# Patient Record
Sex: Female | Born: 1980 | Race: White | Hispanic: No | Marital: Single | Smoking: Current every day smoker
Health system: Southern US, Community
[De-identification: ages and names within clinical notes are randomized; demographics above are authoritative.]

## PROBLEM LIST (undated history)

## (undated) DIAGNOSIS — I10 Essential (primary) hypertension: Secondary | ICD-10-CM

## (undated) DIAGNOSIS — R569 Unspecified convulsions: Secondary | ICD-10-CM

---

## 2016-10-03 HISTORY — PX: FINGER SURGERY: SHX640

## 2016-10-15 ENCOUNTER — Encounter (HOSPITAL_BASED_OUTPATIENT_CLINIC_OR_DEPARTMENT_OTHER): Payer: Self-pay | Admitting: *Deleted

## 2016-10-15 ENCOUNTER — Emergency Department (HOSPITAL_BASED_OUTPATIENT_CLINIC_OR_DEPARTMENT_OTHER)
Admission: EM | Admit: 2016-10-15 | Discharge: 2016-10-15 | Disposition: A | Discharge status: Home / Self Care | Payer: Self-pay | Attending: Emergency Medicine | Admitting: Emergency Medicine

## 2016-10-15 ENCOUNTER — Emergency Department (HOSPITAL_BASED_OUTPATIENT_CLINIC_OR_DEPARTMENT_OTHER): Payer: Self-pay

## 2016-10-15 DIAGNOSIS — F17210 Nicotine dependence, cigarettes, uncomplicated: Secondary | ICD-10-CM | POA: Insufficient documentation

## 2016-10-15 DIAGNOSIS — I10 Essential (primary) hypertension: Secondary | ICD-10-CM | POA: Insufficient documentation

## 2016-10-15 DIAGNOSIS — Z5189 Encounter for other specified aftercare: Secondary | ICD-10-CM

## 2016-10-15 DIAGNOSIS — M7989 Other specified soft tissue disorders: Secondary | ICD-10-CM | POA: Insufficient documentation

## 2016-10-15 HISTORY — DX: Essential (primary) hypertension: I10

## 2016-10-15 HISTORY — DX: Unspecified convulsions: R56.9

## 2016-10-15 LAB — CBC WITH DIFFERENTIAL/PLATELET
Basophils Absolute: 0 10*3/uL (ref 0.0–0.1)
Basophils Relative: 0 %
EOS PCT: 1 %
Eosinophils Absolute: 0.1 10*3/uL (ref 0.0–0.7)
HEMATOCRIT: 41.7 % (ref 36.0–46.0)
HEMOGLOBIN: 13.4 g/dL (ref 12.0–15.0)
LYMPHS PCT: 24 %
Lymphs Abs: 2 10*3/uL (ref 0.7–4.0)
MCH: 19.4 pg — AB (ref 26.0–34.0)
MCHC: 32.1 g/dL (ref 30.0–36.0)
MCV: 60.4 fL — AB (ref 78.0–100.0)
MONOS PCT: 10 %
Monocytes Absolute: 0.8 10*3/uL (ref 0.1–1.0)
NEUTROS ABS: 5.5 10*3/uL (ref 1.7–7.7)
NEUTROS PCT: 65 %
Platelets: 270 10*3/uL (ref 150–400)
RBC: 6.9 MIL/uL — ABNORMAL HIGH (ref 3.87–5.11)
RDW: 18.4 % — ABNORMAL HIGH (ref 11.5–15.5)
WBC: 8.4 10*3/uL (ref 4.0–10.5)

## 2016-10-15 LAB — BASIC METABOLIC PANEL
Anion gap: 6 (ref 5–15)
BUN: 8 mg/dL (ref 6–20)
CHLORIDE: 108 mmol/L (ref 101–111)
CO2: 26 mmol/L (ref 22–32)
Calcium: 8.9 mg/dL (ref 8.9–10.3)
Creatinine, Ser: 0.82 mg/dL (ref 0.44–1.00)
GFR calc Af Amer: >60 mL/min (ref >60)
GLUCOSE: 126 mg/dL — AB (ref 65–99)
POTASSIUM: 3.2 mmol/L — AB (ref 3.5–5.1)
Sodium: 140 mmol/L (ref 135–145)

## 2016-10-15 MED ORDER — CLINDAMYCIN HCL 150 MG PO CAPS: 300.0000 mg | ORAL_CAPSULE | Freq: Three times a day (TID) | ORAL | 0 refills | Status: DC

## 2016-10-15 MED ORDER — CLINDAMYCIN PHOSPHATE 600 MG/50ML IV SOLN
600.0000 mg | Freq: Once | INTRAVENOUS | Status: AC
Start: 2016-10-15 — End: 2016-10-15
  Administered 2016-10-15: 600 mg via INTRAVENOUS
  Filled 2016-10-15: qty 50

## 2016-10-15 NOTE — Discharge Instructions (Signed)
Please contact your surgeon.  Notify them that you came to the ER today and were treated with broader spectrum antibiotics.  We recommend that you seen your surgeon ASAP.  Continue range of motion exercises.  If the redness, pain, or swelling worsen over the next 24-48 hours please return to the ER.

## 2016-10-15 NOTE — ED Triage Notes (Signed)
Patient states she had surgery on 10/03/16 for a staph infection of the left index finger at [facility] in [place].  States has pain in the distal joint of the finger and feels like it is hot to touch.  Still taking amoxicillin as prescribed by the surgeon.

## 2016-10-15 NOTE — ED Provider Notes (Signed)
[facility] Provider Note   CSN: [id number] Arrival date & time: 10/15/16  1205     History   Chief Complaint Chief Complaint  Patient presents with  . Post-op Problem    HPI Dawn Hall is a 36 y.o. female.  Patient presents to the emergency department with chief complaint of postop wound check. She states that she had a staph infection to her left index finger and had surgery at [facility] in [place] on 10/03/16. She reports that she had been feeling well, until today she noticed increased pain in the finger with some new redness and reported warmth. She states that she has been taking amoxicillin which was prescribed by her surgeon in [place]. She states that she has recently relocated to [place]. She reports increased pain with finger flexion. She denies any fever, chills, nausea, vomiting, or diarrhea.   The history is provided by the patient. No language interpreter was used.    Past Medical History:  Diagnosis Date  . Hypertension   . Seizures (HCC)    medication related.     There are no active problems to display for this patient.   Past Surgical History:  Procedure Laterality Date  . FINGER SURGERY Left 10/03/2016   staph infection left index finger    OB History    No data available       Home Medications    Prior to Admission medications   Medication Sig Start Date End Date Taking? Authorizing Provider  atenolol (TENORMIN) 25 MG tablet Take by mouth daily.   Yes Historical Provider, MD  clonazePAM (KLONOPIN) 2 MG tablet Take 2 mg by mouth 2 (two) times daily.   Yes Historical Provider, MD    Family History No family history on file.  Social History Social History  Substance Use Topics  . Smoking status: Current Every Day Smoker    Types: E-cigarettes  . Smokeless tobacco: Never Used  . Alcohol use No     Allergies   Patient has no known allergies.   Review of Systems Review of Systems  All other systems  reviewed and are negative.    Physical Exam Updated Vital Signs BP 142/96 (BP Location: Right Arm)   Temp 98.3 F (36.8 C) (Oral)   Resp 20   Ht 5\' 3"  (1.6 m)   Wt 63.5 kg   LMP 09/24/2016 (Exact Date)   SpO2 100%   BMI 24.80 kg/m   Physical Exam  Constitutional: She is oriented to person, place, and time. No distress.  HENT:  Head: Normocephalic and atraumatic.  Eyes: Conjunctivae and EOM are normal. Pupils are equal, round, and reactive to light.  Neck: No tracheal deviation present.  Cardiovascular: Normal rate.   Pulmonary/Chest: Effort normal. No respiratory distress.  Abdominal: Soft.  Musculoskeletal: Normal range of motion.  Left index finger flexion limited secondary to pain, mild swelling is noted, tender to palpation over the PIP  Neurological: She is alert and oriented to person, place, and time.  Skin: Skin is warm and dry. She is not diaphoretic.  Very mild erythema near the PIP of the left index finger, no purulent discharge  Psychiatric: Judgment normal.  Nursing note and vitals reviewed.        ED Treatments / Results  Labs (all labs ordered are listed, but only abnormal results are displayed) Labs Reviewed - No data to display  EKG  EKG Interpretation None       Radiology No results found.  Procedures  Procedures (including critical care time)  Medications Ordered in ED Medications - No data to display   Initial Impression / Assessment and Plan / ED Course  I have reviewed the triage vital signs and the nursing notes.  Pertinent labs & imaging results that were available during my care of the patient were reviewed by me and considered in my medical decision making (see chart for details).  Clinical Course     Patient with swelling of left index finger.  Had surgery 2 weeks ago at [facility] in [place].  Reports increase pain, swelling, and redness over the past day.  Has been taking amoxicillin.  Plain films shows soft tissue  swelling.  Patient discussed with Dr. Silverio Lay, who recommends labs and loading dose with Clindamycin.  Consultation with hand surgery after labs.  I discussed the case with Dr. Amanda Pea, who agrees with our initial clinical impression that the wound likely does not need more operative management today.  Dr. Amanda Pea agrees with plan to broaden antibiotic regimen.  Recommends that the patient contact her surgeon for close follow-up, but also states that if the symptoms worsen patient can return to ER or schedule an appointment with his office.  States that it would be best if patient continues care with surgeon who knows the situation better.  Final Clinical Impressions(s) / ED Diagnoses   Final diagnoses:  Visit for wound check    New Prescriptions New Prescriptions   CLINDAMYCIN (CLEOCIN) 150 MG CAPSULE    Take 2 capsules (300 mg total) by mouth 3 (three) times daily. May dispense as 150mg  capsules     Roxy Horseman, PA-C 10/15/16 1601    Charlynne Pander, MD 10/15/16 [phone]

## 2016-11-11 ENCOUNTER — Encounter: Payer: Self-pay | Admitting: Family Medicine

## 2016-11-11 ENCOUNTER — Ambulatory Visit: Payer: Self-pay | Admitting: Family Medicine

## 2016-11-11 ENCOUNTER — Ambulatory Visit: Payer: Self-pay | Attending: Family Medicine | Admitting: Family Medicine

## 2016-11-11 VITALS — BP 138/94 | HR 110 | Temp 98.4°F | Resp 18 | Ht 63.0 in | Wt 151.6 lb

## 2016-11-11 DIAGNOSIS — Z87891 Personal history of nicotine dependence: Secondary | ICD-10-CM | POA: Insufficient documentation

## 2016-11-11 DIAGNOSIS — Z79899 Other long term (current) drug therapy: Secondary | ICD-10-CM | POA: Insufficient documentation

## 2016-11-11 DIAGNOSIS — R053 Chronic cough: Secondary | ICD-10-CM

## 2016-11-11 DIAGNOSIS — R05 Cough: Secondary | ICD-10-CM

## 2016-11-11 DIAGNOSIS — J069 Acute upper respiratory infection, unspecified: Secondary | ICD-10-CM | POA: Insufficient documentation

## 2016-11-11 DIAGNOSIS — I10 Essential (primary) hypertension: Secondary | ICD-10-CM | POA: Insufficient documentation

## 2016-11-11 LAB — CBC WITH DIFFERENTIAL/PLATELET
Basophils Absolute: 0 cells/uL (ref 0–200)
Basophils Relative: 0 %
Eosinophils Absolute: 67 cells/uL (ref 15–500)
Eosinophils Relative: 1 %
HEMATOCRIT: 38 % (ref 35.0–45.0)
Hemoglobin: 12.3 g/dL (ref 11.7–15.5)
LYMPHS PCT: 16 %
Lymphs Abs: 1072 cells/uL (ref 850–3900)
MCH: 19.9 pg — ABNORMAL LOW (ref 27.0–33.0)
MCHC: 32.4 g/dL (ref 32.0–36.0)
MCV: 61.5 fL — ABNORMAL LOW (ref 80.0–100.0)
MONO ABS: 737 {cells}/uL (ref 200–950)
MONOS PCT: 11 %
NEUTROS PCT: 72 %
Neutro Abs: 4824 cells/uL (ref 1500–7800)
PLATELETS: 187 10*3/uL (ref 140–400)
RBC: 6.18 MIL/uL — AB (ref 3.80–5.10)
RDW: 17.6 % — ABNORMAL HIGH (ref 11.0–15.0)
WBC: 6.7 10*3/uL (ref 3.8–10.8)

## 2016-11-11 MED ORDER — AZITHROMYCIN 250 MG PO TABS: ORAL_TABLET | ORAL | 0 refills | Status: DC

## 2016-11-11 MED ORDER — BENZONATATE 200 MG PO CAPS: 200.0000 mg | ORAL_CAPSULE | Freq: Two times a day (BID) | ORAL | 0 refills | Status: AC | PRN

## 2016-11-11 MED ORDER — CLONIDINE HCL 0.2 MG PO TABS: 0.2000 mg | ORAL_TABLET | Freq: Every day | ORAL | 2 refills | Status: DC

## 2016-11-11 MED ORDER — ATENOLOL 50 MG PO TABS: 50.0000 mg | ORAL_TABLET | Freq: Every day | ORAL | 2 refills | Status: DC

## 2016-11-11 MED FILL — BENZONATATE 100 MG CAPSULE: 100 | 20 days supply | Qty: 80 | Fill #0

## 2016-11-11 MED FILL — AZITHROMYCIN 250 MG TABLET: 250 | 6 days supply | Qty: 6 | Fill #0

## 2016-11-11 NOTE — Patient Instructions (Signed)
Come back in 1 to 2 week for appointment for BP check up with clinical pharmacist.   Upper Respiratory Infection, Adult Most upper respiratory infections (URIs) are caused by a virus. A URI affects the nose, throat, and upper air passages. The most common type of URI is often called "the common cold." Follow these instructions at home:  Take medicines only as told by your doctor.  Gargle warm saltwater or take cough drops to comfort your throat as told by your doctor.  Use a warm mist humidifier or inhale steam from a shower to increase air moisture. This may make it easier to breathe.  Drink enough fluid to keep your pee (urine) clear or pale yellow.  Eat soups and other clear broths.  Have a healthy diet.  Rest as needed.  Go back to work when your fever is gone or your doctor says it is okay.  You may need to stay home longer to avoid giving your URI to others.  You can also wear a face mask and wash your hands often to prevent spread of the virus.  Use your inhaler more if you have asthma.  Do not use any tobacco products, including cigarettes, chewing tobacco, or electronic cigarettes. If you need help quitting, ask your doctor. Contact a doctor if:  You are getting worse, not better.  Your symptoms are not helped by medicine.  You have chills.  You are getting more short of breath.  You have brown or red mucus.  You have yellow or brown discharge from your nose.  You have pain in your face, especially when you bend forward.  You have a fever.  You have puffy (swollen) neck glands.  You have pain while swallowing.  You have white areas in the back of your throat. Get help right away if:  You have very bad or constant:  Headache.  Ear pain.  Pain in your forehead, behind your eyes, and over your cheekbones (sinus pain).  Chest pain.  You have long-lasting (chronic) lung disease and any of the following:  Wheezing.  Long-lasting  cough.  Coughing up blood.  A change in your usual mucus.  You have a stiff neck.  You have changes in your:  Vision.  Hearing.  Thinking.  Mood. This information is not intended to replace advice given to you by your health care provider. Make sure you discuss any questions you have with your health care provider. Document Released: 03/07/2008 Document Revised: 05/22/2016 Document Reviewed: 12/25/2013 Elsevier Interactive Patient Education  2017 [employer]

## 2016-11-11 NOTE — Progress Notes (Signed)
Subjective:  Patient ID: Dawn Hall, female    DOB: 1981/07/24  Age: 36 y.o. MRN: [id number]  CC: Establish Care   HPI Dawn Hall presents for complaint of cough, runny nose, and sore throat for 3 days. She denies any ear pain, body aches or chills, or fevers. Cough is dry, persistent, and very severe.  Reports cough prevents her from sleeping at night. She denies any hemoptysis or night sweats. She denies being around anyone else with similar symptoms. Reports formerly being an every day smoker 1 pack a day and quit 2 weeks ago. She is also requesting medication refills for her antihypertensive medications. She reports taking atenolol 25 mg daily and clonidine 0.2 mg at night for blood pressure control. She denies taking clonazepam and reports the last time she took this medication was when she was in her 5620's.     Outpatient Medications Prior to Visit  Medication Sig Dispense Refill  . atenolol (TENORMIN) 25 MG tablet Take by mouth daily.    . clonazePAM (KLONOPIN) 2 MG tablet Take 2 mg by mouth 2 (two) times daily.    . clindamycin (CLEOCIN) 150 MG capsule Take 2 capsules (300 mg total) by mouth 3 (three) times daily. May dispense as 150mg  capsules (Patient not taking: Reported on 11/11/2016) 60 capsule 0   No facility-administered medications prior to visit.     ROS Review of Systems  HENT: Positive for postnasal drip, rhinorrhea and sore throat.   Respiratory: Positive for cough.   Cardiovascular: Negative.   Gastrointestinal: Negative.   Skin: Negative.         Objective:  BP (!) 138/94 (BP Location: Left Arm, Patient Position: Sitting, Cuff Size: Normal)   Pulse (!) 110   Temp 98.4 F (36.9 C) (Oral)   Resp 18   Ht 5\' 3"  (1.6 m)   Wt 151 lb 9.6 oz (68.8 kg)   LMP 10/28/2016   SpO2 100%   BMI 26.85 kg/m   BP/Weight 11/11/2016 10/15/2016  Systolic BP 138 136  Diastolic BP 94 94  Wt. (Lbs) 151.6 140  BMI 26.85 24.8     Physical Exam  Constitutional: She has a  sickly appearance.  HENT:  Right Ear: External ear normal.  Left Ear: External ear normal.  Nose: Mucosal edema and rhinorrhea present.  Mouth/Throat: Posterior oropharyngeal erythema present.  Eyes: Pupils are equal, round, and reactive to light.  Cardiovascular: Normal rate, regular rhythm, normal heart sounds and intact distal pulses.   Pulmonary/Chest: Effort normal and breath sounds normal.  Abdominal: Soft. Bowel sounds are normal.  Nursing note and vitals reviewed.    Assessment & Plan:   Problem List Items Addressed This Visit    None    Visit Diagnoses    Upper respiratory tract infection, unspecified type    -  Primary   Relevant Medications   azithromycin (ZITHROMAX) 250 MG tablet   benzonatate (TESSALON) 200 MG capsule   Other Relevant Orders   Respiratory virus panel (Completed)   DG Chest 2 View (Completed)   Basic Metabolic Panel (Completed)   CBC with Differential (Completed)   Persistent dry cough       Relevant Orders   DG Chest 2 View (Completed)   Essential hypertension       Relevant Medications   atenolol (TENORMIN) 50 MG tablet   cloNIDine (CATAPRES) 0.2 MG tablet      Meds ordered this encounter  Medications  . azithromycin (ZITHROMAX) 250 MG tablet  Sig: Take 2 tablets (500 mg) total by mouth day one. Then 1 tablet (250 mg) by mouth daily.    Dispense:  6 tablet    Refill:  0    Order Specific Question:   Supervising Provider    Answer:   Quentin Angst [id number]  . benzonatate (TESSALON) 200 MG capsule    Sig: Take 1 capsule (200 mg total) by mouth 2 (two) times daily as needed for cough.    Dispense:  40 capsule    Refill:  0    Order Specific Question:   Supervising Provider    Answer:   Quentin Angst [id number]  . atenolol (TENORMIN) 50 MG tablet    Sig: Take 1 tablet (50 mg total) by mouth daily.    Dispense:  30 tablet    Refill:  2    Order Specific Question:   Supervising Provider    Answer:   Quentin Angst [id number]  . cloNIDine (CATAPRES) 0.2 MG tablet    Sig: Take 1 tablet (0.2 mg total) by mouth daily.    Dispense:  30 tablet    Refill:  2    Order Specific Question:   Supervising Provider    Answer:   Quentin Angst [id number]    Follow-up: Return in about 2 weeks (around 11/25/2016) for BP check with Stacy.Lizbeth Bark FNP

## 2016-11-11 NOTE — Progress Notes (Signed)
Patient is here for establish care  Patient is here for BP meds refill  Patient has eaten today  Patient has not taking any meds today  Patient complains about a dry cough that's been going on for 3 days  Patient stated that she started to sneeze today  Patient stated that she's only taking cough drop suppressant

## 2016-11-12 ENCOUNTER — Ambulatory Visit (HOSPITAL_BASED_OUTPATIENT_CLINIC_OR_DEPARTMENT_OTHER)
Admission: RE | Admit: 2016-11-12 | Discharge: 2016-11-12 | Disposition: A | Discharge status: Home / Self Care | Payer: Self-pay | Source: Ambulatory Visit | Attending: Family Medicine | Admitting: Family Medicine

## 2016-11-12 DIAGNOSIS — J069 Acute upper respiratory infection, unspecified: Secondary | ICD-10-CM | POA: Insufficient documentation

## 2016-11-12 DIAGNOSIS — R053 Chronic cough: Secondary | ICD-10-CM

## 2016-11-12 DIAGNOSIS — R05 Cough: Secondary | ICD-10-CM | POA: Insufficient documentation

## 2016-11-12 LAB — BASIC METABOLIC PANEL
BUN: 6 mg/dL — AB (ref 7–25)
CALCIUM: 8.5 mg/dL — AB (ref 8.6–10.2)
CO2: 21 mmol/L (ref 20–31)
Chloride: 108 mmol/L (ref 98–110)
Creat: 1.08 mg/dL (ref 0.50–1.10)
GLUCOSE: 112 mg/dL — AB (ref 65–99)
Potassium: 3.4 mmol/L — ABNORMAL LOW (ref 3.5–5.3)
Sodium: 141 mmol/L (ref 135–146)

## 2016-11-14 ENCOUNTER — Other Ambulatory Visit: Payer: Self-pay | Admitting: Family Medicine

## 2016-11-14 ENCOUNTER — Telehealth: Payer: Self-pay | Admitting: Family Medicine

## 2016-11-14 DIAGNOSIS — E876 Hypokalemia: Secondary | ICD-10-CM

## 2016-11-14 DIAGNOSIS — J101 Influenza due to other identified influenza virus with other respiratory manifestations: Secondary | ICD-10-CM

## 2016-11-14 LAB — RESPIRATORY VIRUS PANEL
ADENOVIRUS B: NOT DETECTED
INFLUENZA A H3: NOT DETECTED
INFLUENZA B 1: DETECTED — AB
Influenza A H1: NOT DETECTED
Influenza A: NOT DETECTED
Metapneumovirus: NOT DETECTED
Parainfluenza 1: NOT DETECTED
Parainfluenza 2: NOT DETECTED
Parainfluenza 3: NOT DETECTED
RESPIRATORY SYNCYTIAL VIRUS B: NOT DETECTED
Respiratory Syncytial Virus A: NOT DETECTED
Rhinovirus: NOT DETECTED

## 2016-11-14 MED ORDER — OSELTAMIVIR PHOSPHATE 75 MG PO CAPS: 75.0000 mg | ORAL_CAPSULE | Freq: Two times a day (BID) | ORAL | 0 refills | Status: DC

## 2016-11-14 MED ORDER — POTASSIUM CHLORIDE ER 10 MEQ PO TBCR: 10.0000 meq | EXTENDED_RELEASE_TABLET | Freq: Two times a day (BID) | ORAL | 0 refills | Status: AC

## 2016-11-14 MED FILL — POTASSIUM CL 10 MEQ TAB SA: 10 | 14 days supply | Qty: 28 | Fill #0

## 2016-11-14 MED FILL — ?CLONIDINE HCL 0.2 MG TABLE: 0.2 | 30 days supply | Qty: 30 | Fill #0

## 2016-11-14 MED FILL — ATENOLOL 50 MG TABLET: 50 | 30 days supply | Qty: 30 | Fill #0

## 2016-11-14 NOTE — Telephone Encounter (Signed)
Patient notified of labs results. She is agreeable with plan. Medication will be sent to patients pharmacy of choice.

## 2016-11-14 NOTE — Telephone Encounter (Signed)
Pt. Called requesting x-ray results. Please f/u

## 2016-11-14 NOTE — Telephone Encounter (Signed)
CMA call to go over lab results  Patient Verify DOB  Patient was aware and understood her results  Patient stated that she had some flu symptoms   Patient stated that her roommate had the flu  & that she was having fevers so she ask if she can be prescribe something for it

## 2016-11-14 NOTE — Telephone Encounter (Signed)
-----   Message from Lizbeth BarkMandesia R Hairston, FNP sent at 11/14/2016 12:41 PM EST ----- -CXR was normal. No showed no heart or lung disease. No pneumonia. -Lab showed potassium level is slightly decreased. You were prescribed a potassium supplement to help lower these levels. Schedule appointment to recheck potassium levels after supplement is complete.  -Continue your efforts to quit smoking. -Still awaiting results of respiratory virus panel.

## 2016-11-21 ENCOUNTER — Ambulatory Visit: Payer: Self-pay | Attending: Family Medicine

## 2016-12-08 ENCOUNTER — Telehealth: Payer: Self-pay | Admitting: Family Medicine

## 2016-12-08 ENCOUNTER — Ambulatory Visit: Payer: Self-pay | Attending: Family Medicine | Admitting: Pharmacist

## 2016-12-08 VITALS — BP 139/89 | HR 65

## 2016-12-08 DIAGNOSIS — F1910 Other psychoactive substance abuse, uncomplicated: Secondary | ICD-10-CM

## 2016-12-08 DIAGNOSIS — E876 Hypokalemia: Secondary | ICD-10-CM | POA: Insufficient documentation

## 2016-12-08 DIAGNOSIS — I10 Essential (primary) hypertension: Secondary | ICD-10-CM

## 2016-12-08 LAB — POTASSIUM: POTASSIUM: 4.2 mmol/L (ref 3.5–5.3)

## 2016-12-08 MED ORDER — CLONIDINE HCL 0.2 MG PO TABS: 0.2000 mg | ORAL_TABLET | Freq: Two times a day (BID) | ORAL | 2 refills | Status: DC

## 2016-12-08 MED ORDER — GABAPENTIN 300 MG PO CAPS: 900.0000 mg | ORAL_CAPSULE | Freq: Three times a day (TID) | ORAL | 1 refills | Status: DC

## 2016-12-08 MED ORDER — BUPROPION HCL ER (SR) 150 MG PO TB12: 150.0000 mg | ORAL_TABLET | Freq: Two times a day (BID) | ORAL | 1 refills | Status: DC

## 2016-12-08 MED FILL — BUPROPION SR 150 MG TABLET: 150 | 30 days supply | Qty: 60 | Fill #0

## 2016-12-08 MED FILL — cloNIDine HCL 0.2 MG TABS: 0.2 | 15 days supply | Qty: 30 | Fill #0

## 2016-12-08 MED FILL — GABAPENTIN 300 MG CAPSULE: 300 | 20 days supply | Qty: 180 | Fill #0

## 2016-12-08 NOTE — Telephone Encounter (Signed)
Quantity adjusted for 30 day supply

## 2016-12-08 NOTE — Patient Instructions (Signed)
Thanks for coming to see us!  I have sent your medications to the pharmacy. We do not have gabapentin 800 mg tablets so I have ordered the 300 mg capsules and you can take 3 at a time to get 900 mg.  I have sent a referral to a specialist to help with the management of bupropion and gabapentin.  Follow up with Mandesia in 2 weeks

## 2016-12-08 NOTE — Telephone Encounter (Signed)
Patient states she was in today and filled her Gabapentin and Clonidine, but states the quantity is incorrect Pt states she is instructed to take Gabapentin nine times a day which would equal 270 tablets but the pt only received 180 And states she is instructed to take Clonidine two times a day which would equal 60 tablets but pt only received 30

## 2016-12-08 NOTE — Progress Notes (Signed)
   S:    Patient arrives in good spirits.    Presents to the clinic for hypertension evaluation. Patient was referred on 11/11/16 by Arrie SenateMandesia Hairston NP/Dr. Hyman HopesJegede.  Patient was last seen by Primary Care Provider on 11/11/16.   Patient reports adherence with medications but she needs a refill on the clonidine because it has been out a few days since the dose was increased.  Current BP Medications include:  Atenolol 50 mg daily, and clonidine 0.2 mg BID (someone in [place] increase this last week)  Patient also needs refills on gabapentin 900 mg TID and bupropion 150 mg BID. She received them at the drug abuse treatment center in [place] but needs to establish with someone here.    O:   Last 3 Office BP readings: BP Readings from Last 3 Encounters:  12/08/16 139/89  11/11/16 (!) 138/94  10/15/16 136/94    BMET    Component Value Date/Time   NA 141 11/11/2016 1716   K 3.4 (L) 11/11/2016 1716   CL 108 11/11/2016 1716   CO2 21 11/11/2016 1716   GLUCOSE 112 (H) 11/11/2016 1716   BUN 6 (L) 11/11/2016 1716   CREATININE 1.08 11/11/2016 1716   CALCIUM 8.5 (L) 11/11/2016 1716   GFRNONAA >60 10/15/2016 1429   GFRAA >60 10/15/2016 1429    A/P: Hypertension longstanding currently controlled on current medications.  Continued medications as prescribed, and updated the clonidine dose in our system to reflect the BID dosing. Discussed with Dr. Hyman HopesJegede and we will bridge her bupropion and gabapentin therapy until we can get her established with psychiatry. Patient verbalized understanding.   With low potassium in the past, would consider ACEI/ARB if she needs additional blood pressure control in the future but the clonidine helps her sleep and the atenolol helps her anxiety so will not change those at this time.   Results reviewed and written information provided.   Total time in face-to-face counseling 30 minutes.   F/U Clinic Visit with [facility].  Patient seen with Cheral Bayasey Wells,  PharmD Candidate

## 2016-12-09 ENCOUNTER — Ambulatory Visit: Payer: Self-pay | Admitting: Family Medicine

## 2016-12-09 MED FILL — GABAPENTIN 300 MG CAPSULE: 300 | 30 days supply | Qty: 90 | Fill #0

## 2016-12-09 MED FILL — cloNIDine HCL 0.2 MG TABS: 0.2 | 30 days supply | Qty: 60 | Fill #0

## 2016-12-12 ENCOUNTER — Telehealth: Payer: Self-pay | Admitting: Family Medicine

## 2016-12-12 NOTE — Telephone Encounter (Signed)
Pt. Called requesting lab results from last OV. Please f/u °

## 2016-12-12 NOTE — Telephone Encounter (Signed)
Pt. Called requesting lab results from last OV. Please f/u

## 2016-12-13 ENCOUNTER — Telehealth: Payer: Self-pay

## 2016-12-13 NOTE — Telephone Encounter (Signed)
CMA call to go over lab results    Patient did not answer but CMA left a VM stating the results & if have any question just to call back

## 2016-12-13 NOTE — Telephone Encounter (Signed)
Lab for potassium level is normal. No additional potassium supplementation is needed.

## 2016-12-13 NOTE — Telephone Encounter (Signed)
-----   Message from Lizbeth BarkMandesia R Hairston, FNP sent at 12/13/2016 11:18 AM EDT ----- Potassium level is normal. No additional supplementation needed.

## 2016-12-14 NOTE — Telephone Encounter (Signed)
CMA call to go over lab results  Patient verify DOB  Patient was aware and understood   

## 2016-12-14 NOTE — Telephone Encounter (Signed)
Pt returning call with questions regarding results

## 2017-01-02 MED FILL — BUPROPION SR 150 MG TABLET: 150 | 30 days supply | Qty: 60 | Fill #1

## 2017-01-02 MED FILL — GABAPENTIN 300 MG CAPSULE: 300 | 30 days supply | Qty: 270 | Fill #1

## 2017-01-04 MED FILL — ?CLONIDINE HCL 0.2 MG TABLE: 0.2 | 30 days supply | Qty: 60 | Fill #1

## 2017-01-27 ENCOUNTER — Telehealth: Payer: Self-pay | Admitting: Family Medicine

## 2017-01-27 NOTE — Telephone Encounter (Signed)
Medication Refill:  buPROPion (WELLBUTRIN SR) 150 MG 12 hr tablet  gabapentin (NEURONTIN) 300 MG capsule

## 2017-01-30 NOTE — Telephone Encounter (Signed)
Refill for 30 day supply only. She need to come to office for follow up regarding these medications.

## 2017-01-30 NOTE — Telephone Encounter (Signed)
Error JA 

## 2017-01-31 MED FILL — GABAPENTIN 300 MG CAPSULE: 300 | 20 days supply | Qty: 180 | Fill #2

## 2017-02-01 ENCOUNTER — Other Ambulatory Visit: Payer: Self-pay | Admitting: Internal Medicine

## 2017-02-01 MED FILL — ?CLONIDINE HCL 0.2 MG TABLE: 0.2 | 30 days supply | Qty: 60 | Fill #2

## 2017-02-06 ENCOUNTER — Ambulatory Visit: Payer: Self-pay | Admitting: Family Medicine

## 2017-02-16 ENCOUNTER — Other Ambulatory Visit: Payer: Self-pay | Admitting: Internal Medicine

## 2017-03-06 ENCOUNTER — Ambulatory Visit: Payer: Self-pay | Attending: Family Medicine | Admitting: Family Medicine

## 2017-03-06 ENCOUNTER — Encounter: Payer: Self-pay | Admitting: Family Medicine

## 2017-03-06 VITALS — BP 139/89 | HR 87 | Temp 98.2°F | Resp 18 | Ht 64.0 in | Wt 159.8 lb

## 2017-03-06 DIAGNOSIS — F419 Anxiety disorder, unspecified: Secondary | ICD-10-CM | POA: Insufficient documentation

## 2017-03-06 DIAGNOSIS — Z87898 Personal history of other specified conditions: Secondary | ICD-10-CM

## 2017-03-06 DIAGNOSIS — I10 Essential (primary) hypertension: Secondary | ICD-10-CM

## 2017-03-06 DIAGNOSIS — F329 Major depressive disorder, single episode, unspecified: Secondary | ICD-10-CM | POA: Insufficient documentation

## 2017-03-06 DIAGNOSIS — Z8659 Personal history of other mental and behavioral disorders: Secondary | ICD-10-CM

## 2017-03-06 LAB — POCT UA - MICROALBUMIN
Creatinine, POC: 50 mg/dL
Microalbumin Ur, POC: 10 mg/L

## 2017-03-06 MED ORDER — GABAPENTIN 300 MG PO CAPS: 900.0000 mg | ORAL_CAPSULE | Freq: Three times a day (TID) | ORAL | 2 refills | Status: AC

## 2017-03-06 MED ORDER — BUPROPION HCL ER (SR) 150 MG PO TB12: 150.0000 mg | ORAL_TABLET | Freq: Two times a day (BID) | ORAL | 2 refills | Status: AC

## 2017-03-06 MED ORDER — CLONIDINE HCL 0.2 MG PO TABS: 0.2000 mg | ORAL_TABLET | Freq: Two times a day (BID) | ORAL | 2 refills | Status: AC

## 2017-03-06 MED ORDER — ATENOLOL 50 MG PO TABS: 50.0000 mg | ORAL_TABLET | Freq: Every day | ORAL | 2 refills | Status: AC

## 2017-03-06 MED FILL — BUPROPION SR 150 MG TABLET: 150 | 30 days supply | Qty: 60 | Fill #0

## 2017-03-06 MED FILL — ATENOLOL 50 MG TABLET: 50 | 30 days supply | Qty: 30 | Fill #0

## 2017-03-06 MED FILL — cloNIDine HCL 0.2 MG TABS: 0.2 | 30 days supply | Qty: 60 | Fill #0

## 2017-03-06 NOTE — Patient Instructions (Signed)

## 2017-03-06 NOTE — Progress Notes (Signed)
Subjective:  Patient ID: Dawn Hall, female    DOB: 1981-02-22  Age: 36 y.o. MRN: [id number]  CC: Establish Care   HPI Dawn Hall presents for hypertension follow up. She is not exercising and is not adherent to low salt diet. She does not check her blood pressures at home.  Cardiac symptoms none. Patient denies chest pain, chest pressure/discomfort, claudication, lower extremity edema, near-syncope and palpitations.  Cardiovascular risk factors: hypertension, sedentary lifestyle and smoking/ tobacco exposure. She reports smoking vapes. Use of agents associated with hypertension: noneHistory of target organ damage: none. She is also requesting medication refills for her antihypertensive medications she reports only taking clonidine.   Depression: Patient complains of depression. She complains of anhedonia, depressed mood, fatigue and poor appetite. Onset was approximately more than 10 years ago., stable since that time.  She denies current suicidal and homicidal plan or intent. Possible organic causes contributing are: history of substance abuse..  Risk factors: previous episode of depression, anxiety, and history of substance abuse. Previous treatment includes Wellbutrin and gabapentin and current [facility]. She complains of the following side effects from the treatment: none. She denies additional counseling resources at this time.    Outpatient Medications Prior to Visit  Medication Sig Dispense Refill  . azithromycin (ZITHROMAX) 250 MG tablet Take 2 tablets (500 mg) total by mouth day one. Then 1 tablet (250 mg) by mouth daily. 6 tablet 0  . benzonatate (TESSALON) 200 MG capsule Take 1 capsule (200 mg total) by mouth 2 (two) times daily as needed for cough. 40 capsule 0  . clindamycin (CLEOCIN) 150 MG capsule Take 2 capsules (300 mg total) by mouth 3 (three) times daily. May dispense as 150mg  capsules (Patient not taking: Reported on 11/11/2016) 60  capsule 0  . oseltamivir (TAMIFLU) 75 MG capsule Take 1 capsule (75 mg total) by mouth 2 (two) times daily. 10 capsule 0  . potassium chloride (K-DUR) 10 MEQ tablet Take 1 tablet (10 mEq total) by mouth 2 (two) times daily. 28 tablet 0  . atenolol (TENORMIN) 50 MG tablet Take 1 tablet (50 mg total) by mouth daily. 30 tablet 2  . buPROPion (WELLBUTRIN SR) 150 MG 12 hr tablet Take 1 tablet (150 mg total) by mouth 2 (two) times daily. 60 tablet 1  . cloNIDine (CATAPRES) 0.2 MG tablet Take 1 tablet (0.2 mg total) by mouth 2 (two) times daily. 60 tablet 2  . gabapentin (NEURONTIN) 300 MG capsule Take 3 capsules (900 mg total) by mouth 3 (three) times daily. 270 capsule 1   No facility-administered medications prior to visit.     ROS Review of Systems  Constitutional: Negative.   Eyes: Negative.   Respiratory: Negative.   Cardiovascular: Negative.   Gastrointestinal: Negative.   Psychiatric/Behavioral: Positive for dysphoric mood (history of depression/anxiety). The patient is nervous/anxious (history of depression/anxiety).     Objective:  BP 139/89 (BP Location: Left Arm, Patient Position: Sitting, Cuff Size: Normal)   Pulse 87   Temp 98.2 F (36.8 C) (Oral)   Resp 18   Ht 5\' 4"  (1.626 m)   Wt 159 lb 12.8 oz (72.5 kg)   SpO2 100%   BMI 27.43 kg/m   BP/Weight 03/06/2017 12/08/2016 11/11/2016  Systolic BP 139 139 138  Diastolic BP 89 89 94  Wt. (Lbs) 159.8 - 151.6  BMI 27.43 - 26.85    Physical Exam  Constitutional: She appears well-developed and well-nourished.  Eyes: Conjunctivae are normal. Pupils  are equal, round, and reactive to light.  Neck: Normal range of motion. No JVD present.  Cardiovascular: Normal rate, regular rhythm, normal heart sounds and intact distal pulses.   Pulmonary/Chest: Effort normal and breath sounds normal.  Abdominal: Soft. Bowel sounds are normal.  Skin: Skin is warm and dry.  Psychiatric: Her mood appears anxious. She expresses no homicidal and no  suicidal ideation. She expresses no suicidal plans and no homicidal plans.  Nursing note and vitals reviewed.   Assessment & Plan:   Problem List Items Addressed This Visit    None    Visit Diagnoses    Essential hypertension    -  Primary   Encouraged using all medications prescribed for BP for better BP control.   Follow up with clinical pharmacist in 2 weeks.   Follow up with PCP in 3 months.   Relevant Medications   cloNIDine (CATAPRES) 0.2 MG tablet   atenolol (TENORMIN) 50 MG tablet   buPROPion (WELLBUTRIN SR) 150 MG 12 hr tablet   gabapentin (NEURONTIN) 300 MG capsule   Other Relevant Orders   Lipid Panel   POCT UA - Microalbumin (Completed)   History of depression       Relevant Medications   buPROPion (WELLBUTRIN SR) 150 MG 12 hr tablet   History of anxiety       Relevant Medications   gabapentin (NEURONTIN) 300 MG capsule   History of substance use disorder       Relevant Medications   gabapentin (NEURONTIN) 300 MG capsule      Meds ordered this encounter  Medications  . cloNIDine (CATAPRES) 0.2 MG tablet    Sig: Take 1 tablet (0.2 mg total) by mouth 2 (two) times daily.    Dispense:  60 tablet    Refill:  2    Order Specific Question:   Supervising Provider    Answer:   Quentin Angst [id number]  . atenolol (TENORMIN) 50 MG tablet    Sig: Take 1 tablet (50 mg total) by mouth daily.    Dispense:  30 tablet    Refill:  2    Order Specific Question:   Supervising Provider    Answer:   Quentin Angst [id number]  . buPROPion (WELLBUTRIN SR) 150 MG 12 hr tablet    Sig: Take 1 tablet (150 mg total) by mouth 2 (two) times daily.    Dispense:  60 tablet    Refill:  2    Order Specific Question:   Supervising Provider    Answer:   Quentin Angst [id number]  . gabapentin (NEURONTIN) 300 MG capsule    Sig: Take 3 capsules (900 mg total) by mouth 3 (three) times daily.    Dispense:  270 capsule    Refill:  2    Order Specific Question:    Supervising Provider    Answer:   Quentin Angst [[id number]]    Follow-up: Return in about 2 weeks (around 03/20/2017) for BP check with Stacy.   Lizbeth Bark FNP

## 2017-03-06 NOTE — Progress Notes (Signed)
Patient is here for med refill & f/up   Patient needs refill on bupropion clonidine & gabapentin

## 2017-03-17 ENCOUNTER — Inpatient Hospital Stay (HOSPITAL_BASED_OUTPATIENT_CLINIC_OR_DEPARTMENT_OTHER)
Admission: EM | Admit: 2017-03-17 | Discharge: 2017-03-19 | DRG: 684 | Disposition: A | Discharge status: Home / Self Care | Payer: Self-pay | Attending: Family Medicine | Admitting: Family Medicine

## 2017-03-17 ENCOUNTER — Encounter (HOSPITAL_BASED_OUTPATIENT_CLINIC_OR_DEPARTMENT_OTHER): Payer: Self-pay | Admitting: Emergency Medicine

## 2017-03-17 ENCOUNTER — Emergency Department (HOSPITAL_BASED_OUTPATIENT_CLINIC_OR_DEPARTMENT_OTHER): Payer: Self-pay

## 2017-03-17 DIAGNOSIS — F1990 Other psychoactive substance use, unspecified, uncomplicated: Secondary | ICD-10-CM

## 2017-03-17 DIAGNOSIS — D72828 Other elevated white blood cell count: Secondary | ICD-10-CM

## 2017-03-17 DIAGNOSIS — R0781 Pleurodynia: Secondary | ICD-10-CM

## 2017-03-17 DIAGNOSIS — B349 Viral infection, unspecified: Secondary | ICD-10-CM | POA: Diagnosis present

## 2017-03-17 DIAGNOSIS — R6520 Severe sepsis without septic shock: Secondary | ICD-10-CM

## 2017-03-17 DIAGNOSIS — I959 Hypotension, unspecified: Secondary | ICD-10-CM

## 2017-03-17 DIAGNOSIS — E876 Hypokalemia: Secondary | ICD-10-CM | POA: Diagnosis present

## 2017-03-17 DIAGNOSIS — Z8249 Family history of ischemic heart disease and other diseases of the circulatory system: Secondary | ICD-10-CM

## 2017-03-17 DIAGNOSIS — N179 Acute kidney failure, unspecified: Principal | ICD-10-CM | POA: Diagnosis present

## 2017-03-17 DIAGNOSIS — F17290 Nicotine dependence, other tobacco product, uncomplicated: Secondary | ICD-10-CM | POA: Diagnosis present

## 2017-03-17 DIAGNOSIS — A419 Sepsis, unspecified organism: Secondary | ICD-10-CM

## 2017-03-17 DIAGNOSIS — E860 Dehydration: Secondary | ICD-10-CM | POA: Diagnosis present

## 2017-03-17 DIAGNOSIS — I10 Essential (primary) hypertension: Secondary | ICD-10-CM | POA: Diagnosis present

## 2017-03-17 MED ORDER — NAPROXEN 250 MG PO TABS
500.0000 mg | ORAL_TABLET | Freq: Once | ORAL | Status: AC
  Administered 2017-03-17: 500 mg via ORAL
  Filled 2017-03-17: qty 2

## 2017-03-17 NOTE — ED Triage Notes (Addendum)
PT presents with c/o upper middle chest pain, shortness of breath, LUQ pain for 2 days.

## 2017-03-17 NOTE — ED Provider Notes (Signed)
[facility] Note   CSN: [id number] Arrival date & time: 03/17/17  2329  By signing my name below, I, Diona BrownerJennifer Gorman, attest that this documentation has been prepared under the direction and in the presence of Michal Callicott, Mayer Maskerourtney F, MD. Electronically Signed: Diona BrownerJennifer Gorman, ED Scribe. 03/17/17. 11:46 PM.  History   Chief Complaint Chief Complaint  Patient presents with  . Chest Pain    HPI Dawn Hall is a 36 y.o. female with a PMHx of HTN, who presents to the Emergency Department complaining of sharp, 6/10, left sided chest pain for the last two days. Associated sx include SOB, diaphoresis. Deep breathes exacerbate her pain.  Sometimes the pain radiates into left upper quadrant. Reports chills and initially reported no fever; however, after further probing, patient reports that she had a temperature 100.1.  No hx of blood clots. No recent travel. No hx of heart disease. Smokes. 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 Pt denies cough.  Additionally, pt c/o of not being able to sleep for the last couple of weeks. She has taken benadryl with no relief. Pt also notes being under a lot of stress.   The history is provided by the patient. No language interpreter was used.    Past Medical History:  Diagnosis Date  . Hypertension   . Seizures (HCC)    medication related.     There are no active problems to display for this patient.   Past Surgical History:  Procedure Laterality Date  . FINGER SURGERY Left 10/03/2016   staph infection left index finger    OB History    No data available       Home Medications    Prior to Admission medications   Medication Sig Start Date End Date Taking? Authorizing Provider  atenolol (TENORMIN) 50 MG tablet Take 1 tablet (50 mg total) by mouth daily. 03/06/17   Lizbeth BarkHairston, Mandesia R, FNP  azithromycin (ZITHROMAX) 250 MG tablet Take 2 tablets (500 mg) total by mouth day one. Then 1 tablet (250 mg) by mouth daily.  11/11/16   Lizbeth BarkHairston, Mandesia R, FNP  benzonatate (TESSALON) 200 MG capsule Take 1 capsule (200 mg total) by mouth 2 (two) times daily as needed for cough. 11/11/16   Lizbeth BarkHairston, Mandesia R, FNP  buPROPion (WELLBUTRIN SR) 150 MG 12 hr tablet Take 1 tablet (150 mg total) by mouth 2 (two) times daily. 03/06/17   Lizbeth BarkHairston, Mandesia R, FNP  clindamycin (CLEOCIN) 150 MG capsule Take 2 capsules (300 mg total) by mouth 3 (three) times daily. May dispense as 150mg  capsules Patient not taking: Reported on 11/11/2016 10/15/16   Roxy HorsemanBrowning, Robert, PA-C  cloNIDine (CATAPRES) 0.2 MG tablet Take 1 tablet (0.2 mg total) by mouth 2 (two) times daily. 03/06/17   Lizbeth BarkHairston, Mandesia R, FNP  gabapentin (NEURONTIN) 300 MG capsule Take 3 capsules (900 mg total) by mouth 3 (three) times daily. 03/06/17   Lizbeth BarkHairston, Mandesia R, FNP  oseltamivir (TAMIFLU) 75 MG capsule Take 1 capsule (75 mg total) by mouth 2 (two) times daily. 11/14/16   Lizbeth BarkHairston, Mandesia R, FNP  potassium chloride (K-DUR) 10 MEQ tablet Take 1 tablet (10 mEq total) by mouth 2 (two) times daily. 11/14/16   Lizbeth BarkHairston, Mandesia R, FNP    Family History No family history on file.  Social History Social History  Substance Use Topics  . Smoking status: Current Every Day Smoker    Types: E-cigarettes  . Smokeless tobacco: Never Used  . Alcohol use No  Allergies   Patient has no known allergies.   Review of Systems Review of Systems  Constitutional: Positive for diaphoresis.  Respiratory: Positive for shortness of breath. Negative for cough.   Cardiovascular: Positive for chest pain.  Gastrointestinal: Positive for abdominal pain. Negative for nausea and vomiting.  Skin: Negative for color change and rash.  Psychiatric/Behavioral: Positive for sleep disturbance.  All other systems reviewed and are negative.    Physical Exam Updated Vital Signs BP 112/78   Pulse 74   Temp 97.8 F (36.6 C) (Oral)   Resp 16   Ht 5\' 3"  (1.6 m)   Wt 68 kg (150 lb)   LMP  02/14/2017   SpO2 100%   BMI 26.57 kg/m   Physical Exam  Constitutional: She is oriented to person, place, and time. She appears well-developed and well-nourished. No distress.  HENT:  Head: Normocephalic and atraumatic.  Eyes: Pupils are equal, round, and reactive to light.  Cardiovascular: Normal rate, regular rhythm, normal heart sounds and intact distal pulses.   Pulmonary/Chest: Effort normal. No respiratory distress. She has wheezes. She exhibits tenderness.  Occasional expiratory wheeze  Abdominal: Soft. Bowel sounds are normal. There is no tenderness.  Musculoskeletal: She exhibits no edema.  Neurological: She is alert and oriented to person, place, and time.  Skin: Skin is warm and dry. No rash noted.  Psychiatric: She has a normal mood and affect.  Nursing note and vitals reviewed.    ED Treatments / Results  DIAGNOSTIC STUDIES: Oxygen Saturation is 100% on RA, normal by my interpretation.   COORDINATION OF CARE: 11:46 PM-Discussed next steps with pt which includes . Pt verbalized understanding and is agreeable with the plan.   Labs (all labs ordered are listed, but only abnormal results are displayed) Labs Reviewed  D-DIMER, QUANTITATIVE (NOT AT [facility]) - Abnormal; Notable for the following:       Result Value   D-Dimer, Quant 1.85 (*)    All other components within normal limits  CBC WITH DIFFERENTIAL/PLATELET - Abnormal; Notable for the following:    WBC 19.1 (*)    RBC 7.43 (*)    Hemoglobin 15.1 (*)    MCV 59.9 (*)    MCH 20.3 (*)    RDW 18.7 (*)    Neutro Abs 14.1 (*)    All other components within normal limits  BASIC METABOLIC PANEL - Abnormal; Notable for the following:    Potassium 3.1 (*)    CO2 19 (*)    Glucose, Bld 180 (*)    BUN 22 (*)    Creatinine, Ser 1.61 (*)    GFR calc non Af Amer 40 (*)    GFR calc Af Amer 46 (*)    All other components within normal limits  CULTURE, BLOOD (ROUTINE X 2)  CULTURE, BLOOD (ROUTINE X 2)  TROPONIN I    URINALYSIS, ROUTINE W REFLEX MICROSCOPIC  I-STAT CG4 LACTIC ACID, ED    EKG  EKG Interpretation  Date/Time:  Friday March 17 2017 23:33:21 EDT Ventricular Rate:  75 PR Interval:  130 QRS Duration: 84 QT Interval:  442 QTC Calculation: 493 R Axis:   64 Text Interpretation:  Normal sinus rhythm Possible Left atrial enlargement Prolonged QT Abnormal ECG no prior for comparison Confirmed by Ross Marcus ([id number]) on 03/17/2017 11:36:21 PM       Radiology Dg Chest 2 View  Result Date: 03/18/2017 CLINICAL DATA:  Middle chest pain EXAM: CHEST  2 VIEW COMPARISON:  11/12/2016 FINDINGS: The  heart size and mediastinal contours are within normal limits. Both lungs are clear. The visualized skeletal structures are unremarkable. IMPRESSION: No active cardiopulmonary disease. Electronically Signed   By: Jasmine Pang M.D.   On: 03/18/2017 00:00   Ct Angio Chest Pe W Or Wo Contrast  Result Date: 03/18/2017 CLINICAL DATA:  Acute onset of upper mid chest pain, shortness of breath and left upper quadrant abdominal pain. Elevated D-dimer. Initial encounter. EXAM: CT ANGIOGRAPHY CHEST WITH CONTRAST TECHNIQUE: Multidetector CT imaging of the chest was performed using the standard protocol during bolus administration of intravenous contrast. Multiplanar CT image reconstructions and MIPs were obtained to evaluate the vascular anatomy. CONTRAST:  75 mL of Isovue 370 IV contrast COMPARISON:  Chest radiograph performed 03/17/2017 FINDINGS: Cardiovascular:  There is no evidence of pulmonary embolus. The heart is normal in size. The thoracic aorta is grossly unremarkable. The great vessels are within normal limits. No calcific atherosclerotic disease is seen. Mediastinum/Nodes: The mediastinum is unremarkable in appearance. No mediastinal lymphadenopathy is seen. No pericardial effusion is identified. The thyroid gland is unremarkable. No axillary lymphadenopathy is seen. Lungs/Pleura: The lungs are essentially clear  bilaterally. No focal consolidation, pleural effusion or pneumothorax is seen. No masses are identified. Upper Abdomen: The visualized portions of the liver and spleen are grossly unremarkable. The visualized portions of the gallbladder, adrenal glands and left kidney are within normal limits. Musculoskeletal: No acute osseous abnormalities are identified. The visualized musculature is unremarkable in appearance. Review of the MIP images confirms the above findings. IMPRESSION: 1. No evidence of pulmonary embolus. 2. Lungs clear bilaterally. Electronically Signed   By: Roanna Raider M.D.   On: 03/18/2017 01:20    Procedures Procedures (including critical care time)  CRITICAL CARE Performed by: Shon Baton   Total critical care time: 25 minutes  Critical care time was exclusive of separately billable procedures and treating other patients.  Critical care was necessary to treat or prevent imminent or life-threatening deterioration.  Critical care was time spent personally by me on the following activities: development of treatment plan with patient and/or surrogate as well as nursing, discussions with consultants, evaluation of patient's response to treatment, examination of patient, obtaining history from patient or surrogate, ordering and performing treatments and interventions, ordering and review of laboratory studies, ordering and review of radiographic studies, pulse oximetry and re-evaluation of patient's condition.   Medications Ordered in ED Medications  vancomycin (VANCOCIN) IVPB 1000 mg/200 mL premix (not administered)  naproxen (NAPROSYN) tablet 500 mg (500 mg Oral Given 03/17/17 2356)  iopamidol (ISOVUE-370) 76 % injection 100 mL (100 mLs Intravenous Contrast Given 03/18/17 0048)  sodium chloride 0.9 % bolus 1,000 mL (0 mLs Intravenous Stopped 03/18/17 0156)  sodium chloride 0.9 % bolus 1,000 mL (1,000 mLs Intravenous New Bag/Given 03/18/17 0225)     Initial Impression /  Assessment and Plan / ED Course  I have reviewed the triage vital signs and the nursing notes.  Pertinent labs & imaging results that were available during my care of the patient were reviewed by me and considered in my medical decision making (see chart for details).  Clinical Course as of Mar 18 228  Sat Mar 18, 2017  0159 D-dimer positive. Patient sent for CT scan which is negative for pulmonary embolism. However, she has had persistently marginal blood pressures. Reports she took her blood pressure medication at 11 AM. She also has a notable leukocytosis. Reports MAXIMUM TEMPERATURE of 100.1 earlier today. No other fevers or infectious  symptoms. CT does not show any evidence of infiltrate. Urinalysis, lactate, blood cultures added. Patient does have history of IV drug use.  [CH]    Clinical Course User Index [CH] Jeriah Corkum, Mayer Masker, MD    Patient presents with chest pain ongoing for the last 2 days. She is nontoxic. Initial blood pressure 100/69. She is a history of hypertension. Otherwise vital signs reassuring. Satting 100% on room air. Afebrile. She does have some reproducible pain on exam. Very atypical for ACS.  PE consideration. Initially, patient did not report fever but after further probing states that she had a temperature of 100.1. See clinical course above. After reviewing lab work, patient has acute kidney injury and leukocytosis to 19.1 which is not insignificant. Sepsis workup added. She did respond to 1 L of fluids and was given a total of 30 mL/kg or 2.5 L of fluid. She has a history of IV drug use. Last used 15 days ago. Endocarditis would certainly be a consideration as well. No obvious murmur on exam however, given marginal blood pressure in someone whose other wise hypertensive, leukocytosis, and acute kidney injury which evidence of end organ damage, would opt to treat for severe sepsis and admit for further evaluation and echocardiogram. At this time she does not appear to be  in septic shock with multiple repeat blood pressures reassuring.  Final Clinical Impressions(s) / ED Diagnoses   Final diagnoses:  Pleuritic chest pain  Sepsis, due to unspecified organism (HCC)  AKI (acute kidney injury) (HCC)  Other elevated white blood cell (WBC) count  IVDU (intravenous drug user)    New Prescriptions New Prescriptions   No medications on file   I personally performed the services described in this documentation, which was scribed in my presence. The recorded information has been reviewed and is accurate.     Shon Baton, MD 03/18/17 [phone]

## 2017-03-18 ENCOUNTER — Emergency Department (HOSPITAL_BASED_OUTPATIENT_CLINIC_OR_DEPARTMENT_OTHER): Payer: Self-pay

## 2017-03-18 ENCOUNTER — Encounter (HOSPITAL_COMMUNITY): Payer: Self-pay | Admitting: Internal Medicine

## 2017-03-18 ENCOUNTER — Inpatient Hospital Stay (HOSPITAL_COMMUNITY): Payer: Self-pay

## 2017-03-18 DIAGNOSIS — R6520 Severe sepsis without septic shock: Secondary | ICD-10-CM

## 2017-03-18 DIAGNOSIS — N179 Acute kidney failure, unspecified: Secondary | ICD-10-CM | POA: Diagnosis present

## 2017-03-18 DIAGNOSIS — E876 Hypokalemia: Secondary | ICD-10-CM | POA: Diagnosis present

## 2017-03-18 DIAGNOSIS — A419 Sepsis, unspecified organism: Secondary | ICD-10-CM | POA: Diagnosis present

## 2017-03-18 DIAGNOSIS — I959 Hypotension, unspecified: Secondary | ICD-10-CM

## 2017-03-18 DIAGNOSIS — F1990 Other psychoactive substance use, unspecified, uncomplicated: Secondary | ICD-10-CM | POA: Diagnosis present

## 2017-03-18 LAB — CBC
HCT: 42.2 % (ref 36.0–46.0)
HEMATOCRIT: 40.8 % (ref 36.0–46.0)
Hemoglobin: 13.8 g/dL (ref 12.0–15.0)
Hemoglobin: 13.4 g/dL (ref 12.0–15.0)
MCH: 20.8 pg — ABNORMAL LOW (ref 26.0–34.0)
MCH: 20.8 pg — AB (ref 26.0–34.0)
MCHC: 32.7 g/dL (ref 30.0–36.0)
MCHC: 32.8 g/dL (ref 30.0–36.0)
MCV: 63.6 fL — AB (ref 78.0–100.0)
MCV: 63.3 fL — ABNORMAL LOW (ref 78.0–100.0)
PLATELETS: 325 10*3/uL (ref 150–400)
Platelets: 271 10*3/uL (ref 150–400)
RBC: 6.64 MIL/uL — ABNORMAL HIGH (ref 3.87–5.11)
RBC: 6.45 MIL/uL — ABNORMAL HIGH (ref 3.87–5.11)
RDW: 14.8 % (ref 11.5–15.5)
RDW: 14.9 % (ref 11.5–15.5)
WBC: 19.7 10*3/uL — AB (ref 4.0–10.5)
WBC: 13.9 10*3/uL — ABNORMAL HIGH (ref 4.0–10.5)

## 2017-03-18 LAB — CBC WITH DIFFERENTIAL/PLATELET
Basophils Absolute: 0.1 10*3/uL (ref 0.0–0.1)
Basophils Relative: 0 %
EOS ABS: 0.4 10*3/uL (ref 0.0–0.7)
EOS PCT: 2 %
HCT: 44.5 % (ref 36.0–46.0)
HEMOGLOBIN: 15.1 g/dL — AB (ref 12.0–15.0)
LYMPHS ABS: 3.5 10*3/uL (ref 0.7–4.0)
LYMPHS PCT: 18 %
MCH: 20.3 pg — AB (ref 26.0–34.0)
MCHC: 33.9 g/dL (ref 30.0–36.0)
MCV: 59.9 fL — ABNORMAL LOW (ref 78.0–100.0)
MONOS PCT: 5 %
Monocytes Absolute: 1 10*3/uL (ref 0.1–1.0)
Neutro Abs: 14.1 10*3/uL — ABNORMAL HIGH (ref 1.7–7.7)
Neutrophils Relative %: 74 %
Platelets: 397 10*3/uL (ref 150–400)
RBC: 7.43 MIL/uL — ABNORMAL HIGH (ref 3.87–5.11)
RDW: 18.7 % — ABNORMAL HIGH (ref 11.5–15.5)
WBC: 19.1 10*3/uL — ABNORMAL HIGH (ref 4.0–10.5)

## 2017-03-18 LAB — URINALYSIS, ROUTINE W REFLEX MICROSCOPIC
BILIRUBIN URINE: NEGATIVE
Glucose, UA: NEGATIVE mg/dL
Hgb urine dipstick: NEGATIVE
Ketones, ur: NEGATIVE mg/dL
NITRITE: NEGATIVE
Protein, ur: NEGATIVE mg/dL
pH: 6 (ref 5.0–8.0)

## 2017-03-18 LAB — URINALYSIS, MICROSCOPIC (REFLEX)

## 2017-03-18 LAB — BASIC METABOLIC PANEL
ANION GAP: 8 (ref 5–15)
Anion gap: 14 (ref 5–15)
BUN: 11 mg/dL (ref 6–20)
BUN: 22 mg/dL — AB (ref 6–20)
CALCIUM: 8.9 mg/dL (ref 8.9–10.3)
CHLORIDE: 102 mmol/L (ref 101–111)
CO2: 19 mmol/L — AB (ref 22–32)
CO2: 26 mmol/L (ref 22–32)
CREATININE: 1.61 mg/dL — AB (ref 0.44–1.00)
Calcium: 9.4 mg/dL (ref 8.9–10.3)
Chloride: 107 mmol/L (ref 101–111)
Creatinine, Ser: 1.2 mg/dL — ABNORMAL HIGH (ref 0.44–1.00)
GFR calc Af Amer: >60 mL/min (ref >60)
GFR calc non Af Amer: 40 mL/min — ABNORMAL LOW (ref >60)
GFR calc non Af Amer: 57 mL/min — ABNORMAL LOW (ref >60)
GFR, EST AFRICAN AMERICAN: 46 mL/min — AB (ref >60)
GLUCOSE: 105 mg/dL — AB (ref 65–99)
Glucose, Bld: 180 mg/dL — ABNORMAL HIGH (ref 65–99)
POTASSIUM: 3.1 mmol/L — AB (ref 3.5–5.1)
Potassium: 3.5 mmol/L (ref 3.5–5.1)
SODIUM: 135 mmol/L (ref 135–145)
SODIUM: 141 mmol/L (ref 135–145)

## 2017-03-18 LAB — DIFFERENTIAL
BASOS ABS: 0.1 10*3/uL (ref 0.0–0.1)
BASOS PCT: 0 %
EOS ABS: 0.3 10*3/uL (ref 0.0–0.7)
EOS PCT: 2 %
Lymphocytes Relative: 25 %
Lymphs Abs: 3.5 10*3/uL (ref 0.7–4.0)
Monocytes Absolute: 0.6 10*3/uL (ref 0.1–1.0)
Monocytes Relative: 5 %
NEUTROS PCT: 68 %
Neutro Abs: 9.5 10*3/uL — ABNORMAL HIGH (ref 1.7–7.7)

## 2017-03-18 LAB — RAPID URINE DRUG SCREEN, HOSP PERFORMED
AMPHETAMINES: NOT DETECTED
BENZODIAZEPINES: NOT DETECTED
Barbiturates: NOT DETECTED
Cocaine: NOT DETECTED
Opiates: NOT DETECTED
Tetrahydrocannabinol: NOT DETECTED

## 2017-03-18 LAB — D-DIMER, QUANTITATIVE: D-Dimer, Quant: 1.85 ug/mL-FEU — ABNORMAL HIGH (ref 0.00–0.50)

## 2017-03-18 LAB — TROPONIN I: Troponin I: <0.03 ng/mL (ref <0.03)

## 2017-03-18 LAB — I-STAT CG4 LACTIC ACID, ED: LACTIC ACID, VENOUS: 1.67 mmol/L (ref 0.5–1.9)

## 2017-03-18 LAB — MRSA PCR SCREENING: MRSA BY PCR: NEGATIVE

## 2017-03-18 MED ORDER — SODIUM CHLORIDE 0.9 % IV BOLUS (SEPSIS)
500.0000 mL | Freq: Once | INTRAVENOUS | Status: AC
  Administered 2017-03-18: 500 mL via INTRAVENOUS

## 2017-03-18 MED ORDER — SODIUM CHLORIDE 0.9 % IV SOLN: INTRAVENOUS | Status: AC

## 2017-03-18 MED ORDER — ATENOLOL 50 MG PO TABS
50.0000 mg | ORAL_TABLET | Freq: Every day | ORAL | Status: DC
  Administered 2017-03-18 – 2017-03-19 (×2): 50 mg via ORAL
  Filled 2017-03-18 (×2): qty 1

## 2017-03-18 MED ORDER — FAMOTIDINE 20 MG PO TABS
20.0000 mg | ORAL_TABLET | Freq: Every day | ORAL | Status: DC
  Administered 2017-03-18 – 2017-03-19 (×2): 20 mg via ORAL
  Filled 2017-03-18 (×2): qty 1

## 2017-03-18 MED ORDER — SODIUM CHLORIDE 0.9 % IV BOLUS (SEPSIS)
1000.0000 mL | Freq: Once | INTRAVENOUS | Status: AC
  Administered 2017-03-18: 1000 mL via INTRAVENOUS

## 2017-03-18 MED ORDER — ACETAMINOPHEN 325 MG PO TABS: 650.0000 mg | ORAL_TABLET | Freq: Four times a day (QID) | ORAL | Status: DC | PRN

## 2017-03-18 MED ORDER — GABAPENTIN 300 MG PO CAPS
900.0000 mg | ORAL_CAPSULE | Freq: Three times a day (TID) | ORAL | Status: DC
  Administered 2017-03-18 – 2017-03-19 (×5): 900 mg via ORAL
  Filled 2017-03-18 (×5): qty 3

## 2017-03-18 MED ORDER — POTASSIUM CHLORIDE CRYS ER 20 MEQ PO TBCR
40.0000 meq | EXTENDED_RELEASE_TABLET | Freq: Once | ORAL | Status: AC
  Administered 2017-03-18: 40 meq via ORAL
  Filled 2017-03-18: qty 2

## 2017-03-18 MED ORDER — TRAZODONE HCL 50 MG PO TABS
50.0000 mg | ORAL_TABLET | Freq: Every evening | ORAL | Status: DC | PRN
  Administered 2017-03-18: 50 mg via ORAL
  Filled 2017-03-18: qty 1

## 2017-03-18 MED ORDER — NAPROXEN 500 MG PO TABS
500.0000 mg | ORAL_TABLET | Freq: Two times a day (BID) | ORAL | Status: DC | PRN
  Administered 2017-03-18 – 2017-03-19 (×2): 500 mg via ORAL
  Filled 2017-03-18: qty 1
  Filled 2017-03-18: qty 2
  Filled 2017-03-18: qty 1

## 2017-03-18 MED ORDER — BENZONATATE 100 MG PO CAPS: 200.0000 mg | ORAL_CAPSULE | Freq: Two times a day (BID) | ORAL | Status: DC | PRN

## 2017-03-18 MED ORDER — BUPROPION HCL ER (SR) 150 MG PO TB12
150.0000 mg | ORAL_TABLET | Freq: Two times a day (BID) | ORAL | Status: DC
  Administered 2017-03-18 – 2017-03-19 (×3): 150 mg via ORAL
  Filled 2017-03-18 (×3): qty 1

## 2017-03-18 MED ORDER — IOPAMIDOL (ISOVUE-370) INJECTION 76%
100.0000 mL | Freq: Once | INTRAVENOUS | Status: AC | PRN
  Administered 2017-03-18: 100 mL via INTRAVENOUS

## 2017-03-18 MED ORDER — DIPHENHYDRAMINE HCL 25 MG PO CAPS: 25.0000 mg | ORAL_CAPSULE | Freq: Every evening | ORAL | Status: DC | PRN

## 2017-03-18 MED ORDER — ENOXAPARIN SODIUM 40 MG/0.4ML ~~LOC~~ SOLN
40.0000 mg | SUBCUTANEOUS | Status: DC
  Administered 2017-03-18 – 2017-03-19 (×2): 40 mg via SUBCUTANEOUS
  Filled 2017-03-18 (×2): qty 0.4

## 2017-03-18 MED ORDER — PIPERACILLIN-TAZOBACTAM 3.375 G IVPB 30 MIN
3.3750 g | Freq: Once | INTRAVENOUS | Status: AC
  Administered 2017-03-18: 3.375 g via INTRAVENOUS
  Filled 2017-03-18 (×2): qty 50

## 2017-03-18 MED ORDER — VANCOMYCIN HCL IN DEXTROSE 1-5 GM/200ML-% IV SOLN
1000.0000 mg | Freq: Once | INTRAVENOUS | Status: AC
  Administered 2017-03-18: 1000 mg via INTRAVENOUS
  Filled 2017-03-18: qty 200

## 2017-03-18 MED ORDER — PIPERACILLIN-TAZOBACTAM 3.375 G IVPB
3.3750 g | Freq: Three times a day (TID) | INTRAVENOUS | Status: DC
  Administered 2017-03-18 – 2017-03-19 (×4): 3.375 g via INTRAVENOUS
  Filled 2017-03-18 (×4): qty 50

## 2017-03-18 MED ORDER — VANCOMYCIN HCL IN DEXTROSE 750-5 MG/150ML-% IV SOLN
750.0000 mg | Freq: Two times a day (BID) | INTRAVENOUS | Status: DC
  Administered 2017-03-18 – 2017-03-19 (×3): 750 mg via INTRAVENOUS
  Filled 2017-03-18 (×3): qty 150

## 2017-03-18 NOTE — ED Notes (Addendum)
Shortness of breath started yesterday while she was walking short distance.  Pt denies smoking but she uses vape.

## 2017-03-18 NOTE — ED Notes (Signed)
ED Provider at bedside discussing test results and dispo plan of care. 

## 2017-03-18 NOTE — Plan of Care (Signed)
36 yo F with IVDU that is ongoing.  Presents to ED with CP.  Has severe sepsis with hypotension, AKI, CT chest is neg for PE and PNA.  No murmur but EDP concerned for endocarditis.  UA just small leuks.  WBC 19.1k with left shift.  Gotten 2L IVF thus far.  BP 108/73.  [facility] assuming BP remains stable.

## 2017-03-18 NOTE — Progress Notes (Signed)
Female visitor named Dawn Hall expressed concern to this RN and day shift RN regarding patient's ordered Gabapentin. Visitor stated this medication is "not on her allowed medication list" and it would cause patient to "have a relapse." Patient is in rehab for IV drug addiction. Visitor also asked several questions regarding liver function tests and patient's Creatinine level. This RN spoke in patient's room with patient and visitor, stating visitor Dawn Hall is not on patient's emergency contact list so could not be told any of the patient's information without patient's consent. Visitor stated she needed to be listed in case of an emergency, and told the patient to put her name down. RN asked patient if she was OK with this, and pt replied yes. RN also told patient & visitor that the patient had to be the one to refuse the ordered Gabapentin, not the visitor. Pt stated she would "hold off" on taking the Gabapentin tonight and speak with the MD tomorrow. After visitor Dawn Hall left the hospital, patient called RN into her room and stated, " I do want my Gabapentin tonight, and I want you to take her off of my contact list. She's just a volunteer and she pressured me into it." Patient stated she did not know visitor before going to the rehab facility she is in. Visitor Dawn Hall's name was removed from emergency contact list and Gabapentin was given as ordered. Patient apologized to RN for putting her in that position.

## 2017-03-18 NOTE — ED Notes (Signed)
ED Provider at bedside. 

## 2017-03-18 NOTE — Progress Notes (Signed)
MD paged about elevated BP and possible need to restart BP medication. RN to continue to monitor.

## 2017-03-18 NOTE — Progress Notes (Signed)
Pharmacy Antibiotic Note  Dawn DimesSonya Hall is a 36 y.o. female with hx of IVDU c/o CP admitted on 03/17/2017 with sepsis.  Pharmacy has been consulted for zosyn/vancomycin dosing.  Plan: Zosyn 3.375g IV q8h (4 hour infusion).  Vancomycin 1 Gm x 1 then 750 mg IV q12h VT=15-20 mg/L Daily Scr F/u cultures/levels  Height: 5\' 3"  (160 cm) Weight: 155 lb 3.3 oz (70.4 kg) IBW/kg (Calculated) : 52.4  Temp (24hrs), Avg:98.1 F (36.7 C), Min:97.8 F (36.6 C), Max:98.4 F (36.9 C)   Recent Labs Lab 03/18/17 0002 03/18/17 0216  WBC 19.1*  --   CREATININE 1.61*  --   LATICACIDVEN  --  1.67    Estimated Creatinine Clearance: 44.6 mL/min (A) (by C-G formula based on SCr of 1.61 mg/dL (H)).    No Known Allergies  Antimicrobials this admission: 6/16 zosyn >>  6/16 vancomycin >>   Dose adjustments this admission:   Microbiology results:  BCx:   UCx:    Sputum:    MRSA PCR:   Thank you for allowing pharmacy to be a part of this patient's care.  Lorenza EvangelistGreen, Leevi Cullars R 03/18/2017 5:16 AM

## 2017-03-18 NOTE — Progress Notes (Signed)
Patient received to room 1420. A&Ox4. Tele placed. Oriented to room and call bell. Agree with ICU RN Assessment.

## 2017-03-18 NOTE — Progress Notes (Signed)
[facility]   Patient admitted after midnight see H&P for further details.   36 y/o F with PMHx of HTN and IVDU, presented to the ED with recurrent chest pain for 3 days PTA, associated with SOB. Pain exacerbated with deep breathing. Patient admitted with signs of SIRS and suspected endocarditis.   Impression:  SIRS - does not meet criteria for sepsis, Only elevated WBC, no fever, HR or breathing abnormalities. Isolated low BP in the 80 which responded to IVF. Only concern at this point is that patient is an IV drug user and could potentially have endocarditis, last needle was 16 days ago. Will await blood cultures if they are positive will order TEE. Continue abx for now. DDx Pleurisy vs viral infection. Will repeat CXR in case that she was dehydrated and initial did not reveal any abnormalities. Repeat CBC.  AKI - likely due to dehydration  Patient received IVF Will repeat BMP now   Can transfer to medical floor.   Latrelle DodrillEdwin Silva, MD

## 2017-03-18 NOTE — H&P (Signed)
History and Physical    Dawn Hall ZOX:[id number] DOB: Feb 09, 1981 DOA: 03/17/2017  PCP: Lizbeth BarkHairston, Mandesia R, FNP  [facility]  I have personally briefly reviewed patient's [facility]  Chief Complaint: Chest pain  HPI: Dawn DimesSonya Hall is a 36 y.o. female with medical history significant of HTN, IVDU.  Patient presents to the ED with c/o 6/10, L sided chest pain.  Symptoms ongoing for the past 2 days.  Associated SOB, diaphoresis, fever with Tm 100.1 at home.  Pain worse with deep breathing.  No h/o blood clots, no recent travel, no h/o nor family h/o heart disease.  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   ED Course: CT chest is negative.  Initial BP 80s systolic, improves to 140s now after 2.5L NS bolus.  WBC 19.1k with L shift.  Creat of 1.6 up from baseline 1.0.  Trop is negative and EKG isnt very impressive.  UA shows 6-30 WBC and only small leuk esterase.  Patient started on empiric zosyn and vanc for presumed severe sepsis of unclear origin.  BCx x2 drawn.  EDP relates concern regarding possible endocarditis.   Review of Systems: As per HPI otherwise 10 point review of systems negative.   Past Medical History:  Diagnosis Date  . Hypertension   . Seizures (HCC)    medication related.     Past Surgical History:  Procedure Laterality Date  . FINGER SURGERY Left 10/03/2016   staph infection left index finger     reports that she has been smoking E-cigarettes.  She has never used smokeless tobacco. She reports that she does not drink alcohol or use drugs.  No Known Allergies  Family History  Problem Relation Age of Onset  . Hypertension Mother    Recent URI in family but no other recent illnesses.  Prior to Admission medications   Medication Sig Start Date End Date Taking? Authorizing Provider  atenolol (TENORMIN) 50 MG tablet Take 1 tablet (50 mg total) by mouth daily. Patient taking differently: Take 50 mg by mouth  daily after breakfast.  03/06/17  Yes Hairston, Mandesia R, FNP  buPROPion (WELLBUTRIN SR) 150 MG 12 hr tablet Take 1 tablet (150 mg total) by mouth 2 (two) times daily. 03/06/17  Yes Hairston, Oren BeckmannMandesia R, FNP  cloNIDine (CATAPRES) 0.2 MG tablet Take 1 tablet (0.2 mg total) by mouth 2 (two) times daily. 03/06/17  Yes Hairston, Oren BeckmannMandesia R, FNP  gabapentin (NEURONTIN) 300 MG capsule Take 3 capsules (900 mg total) by mouth 3 (three) times daily. 03/06/17  Yes Hairston, Oren BeckmannMandesia R, FNP  benzonatate (TESSALON) 200 MG capsule Take 1 capsule (200 mg total) by mouth 2 (two) times daily as needed for cough. Patient not taking: Reported on 03/18/2017 11/11/16   Lizbeth BarkHairston, Mandesia R, FNP  potassium chloride (K-DUR) 10 MEQ tablet Take 1 tablet (10 mEq total) by mouth 2 (two) times daily. Patient not taking: Reported on 03/18/2017 11/14/16   Lizbeth BarkHairston, Mandesia R, FNP    Physical Exam: Vitals:   03/18/17 0230 03/18/17 0300 03/18/17 0341 03/18/17 0500  BP: 108/73 121/86 118/75 (!) 144/81  Pulse: 76 83 84   Resp: 13 16 16 15   Temp:   98.1 F (36.7 C) 98.4 F (36.9 C)  TempSrc:   Oral Oral  SpO2: 100% 100% 100% 99%  Weight:    70.4 kg (155 lb 3.3 oz)  Height:    5\' 3"  (1.6 m)    Constitutional: NAD, calm, comfortable  Eyes: PERRL, lids and conjunctivae normal ENMT: Mucous membranes are moist. Posterior pharynx clear of any exudate or lesions.Normal dentition.  Neck: normal, supple, no masses, no thyromegaly Respiratory: clear to auscultation bilaterally, no wheezing, no crackles. Normal respiratory effort. No accessory muscle use.  Cardiovascular: Regular rate and rhythm, no murmurs / rubs / gallops. No extremity edema. 2+ pedal pulses. No carotid bruits.  Abdomen: no tenderness, no masses palpated. No hepatosplenomegaly. Bowel sounds positive.  Musculoskeletal: no clubbing / cyanosis. No joint deformity upper and lower extremities. Good ROM, no contractures. Normal muscle tone.  Skin: no rashes, lesions, ulcers.  No induration Neurologic: CN 2-12 grossly intact. Sensation intact, DTR normal. Strength 5/5 in all 4.  Psychiatric: Normal judgment and insight. Alert and oriented x 3. Normal mood.    Labs on Admission: I have personally reviewed following labs and imaging studies  CBC:  Recent Labs Lab 03/18/17 0002  WBC 19.1*  NEUTROABS 14.1*  HGB 15.1*  HCT 44.5  MCV 59.9*  PLT 397   Basic Metabolic Panel:  Recent Labs Lab 03/18/17 0002  NA 135  K 3.1*  CL 102  CO2 19*  GLUCOSE 180*  BUN 22*  CREATININE 1.61*  CALCIUM 9.4   GFR: Estimated Creatinine Clearance: 44.6 mL/min (A) (by C-G formula based on SCr of 1.61 mg/dL (H)). Liver Function Tests: No results for input(s): AST, ALT, ALKPHOS, BILITOT, PROT, ALBUMIN in the last 168 hours. No results for input(s): LIPASE, AMYLASE in the last 168 hours. No results for input(s): AMMONIA in the last 168 hours. Coagulation Profile: No results for input(s): INR, PROTIME in the last 168 hours. Cardiac Enzymes:  Recent Labs Lab 03/18/17 0002  TROPONINI <0.03   BNP (last 3 results) No results for input(s): PROBNP in the last 8760 hours. HbA1C: No results for input(s): HGBA1C in the last 72 hours. CBG: No results for input(s): GLUCAP in the last 168 hours. Lipid Profile: No results for input(s): CHOL, HDL, LDLCALC, TRIG, CHOLHDL, LDLDIRECT in the last 72 hours. Thyroid Function Tests: No results for input(s): TSH, T4TOTAL, FREET4, T3FREE, THYROIDAB in the last 72 hours. Anemia Panel: No results for input(s): VITAMINB12, FOLATE, FERRITIN, TIBC, IRON, RETICCTPCT in the last 72 hours. Urine analysis:    Component Value Date/Time   COLORURINE YELLOW 03/18/2017 0206   APPEARANCEUR CLEAR 03/18/2017 0206   LABSPEC >1.046 (H) 03/18/2017 0206   PHURINE 6.0 03/18/2017 0206   GLUCOSEU NEGATIVE 03/18/2017 0206   HGBUR NEGATIVE 03/18/2017 0206   BILIRUBINUR NEGATIVE 03/18/2017 0206   KETONESUR NEGATIVE 03/18/2017 0206   PROTEINUR  NEGATIVE 03/18/2017 0206   NITRITE NEGATIVE 03/18/2017 0206   LEUKOCYTESUR SMALL (A) 03/18/2017 0206    Radiological Exams on Admission: Dg Chest 2 View  Result Date: 03/18/2017 CLINICAL DATA:  Middle chest pain EXAM: CHEST  2 VIEW COMPARISON:  11/12/2016 FINDINGS: The heart size and mediastinal contours are within normal limits. Both lungs are clear. The visualized skeletal structures are unremarkable. IMPRESSION: No active cardiopulmonary disease. Electronically Signed   By: Jasmine Pang M.D.   On: 03/18/2017 00:00   Ct Angio Chest Pe W Or Wo Contrast  Result Date: 03/18/2017 CLINICAL DATA:  Acute onset of upper mid chest pain, shortness of breath and left upper quadrant abdominal pain. Elevated D-dimer. Initial encounter. EXAM: CT ANGIOGRAPHY CHEST WITH CONTRAST TECHNIQUE: Multidetector CT imaging of the chest was performed using the standard protocol during bolus administration of intravenous contrast. Multiplanar CT image reconstructions and MIPs were obtained to evaluate the vascular  anatomy. CONTRAST:  75 mL of Isovue 370 IV contrast COMPARISON:  Chest radiograph performed 03/17/2017 FINDINGS: Cardiovascular:  There is no evidence of pulmonary embolus. The heart is normal in size. The thoracic aorta is grossly unremarkable. The great vessels are within normal limits. No calcific atherosclerotic disease is seen. Mediastinum/Nodes: The mediastinum is unremarkable in appearance. No mediastinal lymphadenopathy is seen. No pericardial effusion is identified. The thyroid gland is unremarkable. No axillary lymphadenopathy is seen. Lungs/Pleura: The lungs are essentially clear bilaterally. No focal consolidation, pleural effusion or pneumothorax is seen. No masses are identified. Upper Abdomen: The visualized portions of the liver and spleen are grossly unremarkable. The visualized portions of the gallbladder, adrenal glands and left kidney are within normal limits. Musculoskeletal: No acute osseous  abnormalities are identified. The visualized musculature is unremarkable in appearance. Review of the MIP images confirms the above findings. IMPRESSION: 1. No evidence of pulmonary embolus. 2. Lungs clear bilaterally. Electronically Signed   By: Roanna Raider M.D.   On: 03/18/2017 01:20    EKG: Independently reviewed.  Assessment/Plan Principal Problem:   Severe sepsis (HCC) Active Problems:   Sepsis associated hypotension (HCC)   AKI (acute kidney injury) (HCC)   IVDU (intravenous drug user)   Hypokalemia    1. Severe sepsis - 1. CT chest and UA are neg 2. BCx pending 3. Concern for possible endocarditis in this patient with known recent h/o IVDU 4. Chest pain felt to be unlikely to represent ACS, trop neg 1. Continue tele monitor 5. Continue empiric zosyn and vanc 6. Trend WBC with repeat CBC 7. Tylenol PRN fever 8. Naproxen PRN pain 2. Sepsis associated hypotension - 1. Resolved post 2.5L NS bolus 2. Will continue to hold atenolol and clonadine 3. NS at 125 cc/hr 3. AKI - secondary to above 1. Strict intake and output 2. IVF as above 3. Repeat BMP tomorrow AM 4. FeNa if no improvement 4. Hypokalemia - replace 5. IVDU - 1. Continue Neurontin, wellbutrin 2. Holding clonadine for the moment due to recent hypotension in ED   DVT prophylaxis: Lovenox Code Status: Full Family Communication: No family in room Disposition Plan: Home after admit Consults called: None Admission status: Admit to inpatient   Hillary Bow DO [facility] Pager [phone]  If 7AM-7PM, please contact day team taking care of patient [email or web address] Password [email or web address]  03/18/2017, 6:01 AM

## 2017-03-19 DIAGNOSIS — R6510 Systemic inflammatory response syndrome (SIRS) of non-infectious origin without acute organ dysfunction: Secondary | ICD-10-CM

## 2017-03-19 DIAGNOSIS — D72828 Other elevated white blood cell count: Secondary | ICD-10-CM

## 2017-03-19 DIAGNOSIS — N179 Acute kidney failure, unspecified: Principal | ICD-10-CM

## 2017-03-19 DIAGNOSIS — R0781 Pleurodynia: Secondary | ICD-10-CM

## 2017-03-19 DIAGNOSIS — F1990 Other psychoactive substance use, unspecified, uncomplicated: Secondary | ICD-10-CM

## 2017-03-19 LAB — CBC WITH DIFFERENTIAL/PLATELET
BASOS ABS: 0.1 10*3/uL (ref 0.0–0.1)
BASOS PCT: 1 %
EOS ABS: 0.5 10*3/uL (ref 0.0–0.7)
Eosinophils Relative: 4 %
HEMATOCRIT: 41.8 % (ref 36.0–46.0)
Hemoglobin: 13.7 g/dL (ref 12.0–15.0)
LYMPHS ABS: 3.7 10*3/uL (ref 0.7–4.0)
LYMPHS PCT: 31 %
MCH: 20.9 pg — ABNORMAL LOW (ref 26.0–34.0)
MCHC: 32.8 g/dL (ref 30.0–36.0)
MCV: 63.6 fL — ABNORMAL LOW (ref 78.0–100.0)
MONO ABS: 0.7 10*3/uL (ref 0.1–1.0)
Monocytes Relative: 6 %
NEUTROS ABS: 7 10*3/uL (ref 1.7–7.7)
Neutrophils Relative %: 58 %
PLATELETS: 237 10*3/uL (ref 150–400)
RBC: 6.57 MIL/uL — ABNORMAL HIGH (ref 3.87–5.11)
RDW: 14.9 % (ref 11.5–15.5)
WBC: 12 10*3/uL — ABNORMAL HIGH (ref 4.0–10.5)

## 2017-03-19 LAB — BASIC METABOLIC PANEL
ANION GAP: 8 (ref 5–15)
BUN: 11 mg/dL (ref 6–20)
CALCIUM: 8.7 mg/dL — AB (ref 8.9–10.3)
CO2: 25 mmol/L (ref 22–32)
Chloride: 107 mmol/L (ref 101–111)
Creatinine, Ser: 1.19 mg/dL — ABNORMAL HIGH (ref 0.44–1.00)
GFR calc Af Amer: >60 mL/min (ref >60)
GFR, EST NON AFRICAN AMERICAN: 57 mL/min — AB (ref >60)
GLUCOSE: 96 mg/dL (ref 65–99)
Potassium: 3.8 mmol/L (ref 3.5–5.1)
Sodium: 140 mmol/L (ref 135–145)

## 2017-03-19 LAB — URINE CULTURE: CULTURE: NO GROWTH

## 2017-03-19 MED ORDER — CLONIDINE HCL 0.2 MG PO TABS
0.2000 mg | ORAL_TABLET | Freq: Two times a day (BID) | ORAL | Status: DC
  Administered 2017-03-19: 0.2 mg via ORAL
  Filled 2017-03-19: qty 1

## 2017-03-19 MED ORDER — NAPROXEN 500 MG PO TABS: 500.0000 mg | ORAL_TABLET | Freq: Two times a day (BID) | ORAL | 0 refills | Status: AC | PRN

## 2017-03-19 NOTE — Discharge Summary (Signed)
Physician Discharge Summary  Dawn Hall  ZOX:[id number]  DOB: 05-Dec-1980  DOA: 03/17/2017 PCP: Dawn Bark, FNP  Admit date: 03/17/2017 Discharge date: 03/19/2017  Admitted From: Home  Disposition:  Home   Recommendations for Outpatient Follow-up:  1. Follow up with PCP in 1 week 2. Please obtain BMP/CBC in one week to monitor WBC and renal function  3. Please follow up on the following pending results: Final blood cultures   Home Health: None  Equipment/Devices: None   Discharge Condition: Stable  CODE STATUS: FULL  Diet recommendation: Heart Healthy    Brief/Interim Summary: 36 y/o F with PMHx of HTN and IVDU, presented to the ED with recurrent chest pain for 3 days PTA, associated with SOB. Pain exacerbated with deep breathing. Upper ED evaluation WBC was found to be 19.7 and isolated blood pressure in the 80s. Patient was admitted for concern of SIRS with suspected endocarditis given IV drug use. Patient was started on Zosyn and vancomycin and blood cultures were drawn which are negative thus far. Other workup which included x-ray 2 was negative, UA which was normal and no other source of infections were identified. Patient subsequently improved pain has resolved and white count has normalized. Patient was also noted to have acute renal injury with creatinine of 1.6 which was treated with IV fluids and improved. Given no source of infection were identified abx were discontinued. Patient will be discharge home to follow up with PCP in 1 weeks.   Subjective: Patient seen and examined on the day of discharge. She reports she had some mild body aches, but no other complaints. Patient remains afebrile, tolerating diet and renal function improving. Patient denies chest pain, shortness of breath, cough, dysuria, abdominal pain and diarrhea.  Discharge Diagnoses/Hospital Course:  SIRS - does not meet criteria for sepsis, Only elevated WBC, no fever, HR or breathing abnormalities.  Isolated low BP in the 80 which responded to IVF. Only concern at this point is that patient is an IV drug user and could potentially have endocarditis although with blood cultures showing no growth, no fever and no murmurs this is less likely. Leukocytosis likely reactive as patient was dehydrated or viral infection. Chest x-ray 2 was negative, UA with no abnormalities and WBC normalizing. We'll discontinue antibiotics and advised patient to follow-up with primary care physician. She is advised to return to the emergency department in case of high-grade fevers. Encourage oral hydration. We'll follow up blood cultures if positive at any point will call patient to return to emergency room.  AKI - likely due to dehydration  Treated with IV fluids serum creatinine improving. Repeat BMP in 1 week Encourage oral hydration  Pleuritic chest pain - likely viral infection probably causing leukocytosis.  Pain has improved. Patient report this is her second time having pleurisy, may need ultimately workup as an outpatient.   IV drug user  Cessation advice patient reported that she quit 20 days ago. 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   On the day of the discharge the patient's vitals were stable, and no other acute medical condition were reported by patient. Patient was felt safe to be discharge to home  Discharge Instructions  You were cared for by a hospitalist during your hospital stay. If you have any questions about your discharge medications or the care you received while you were in the hospital after you are discharged, you can call the unit and asked to speak with the hospitalist on call if  the hospitalist that took care of you is not available. Once you are discharged, your primary care physician will handle any further medical issues. Please note that NO REFILLS for any discharge medications will be authorized once you are discharged, as it is imperative that you return to your primary  care physician (or establish a relationship with a primary care physician if you do not have one) for your aftercare needs so that they can reassess your need for medications and monitor your lab values.  Discharge Instructions    Call MD for:  difficulty breathing, headache or visual disturbances    Complete by:  As directed    Call MD for:  extreme fatigue    Complete by:  As directed    Call MD for:  hives    Complete by:  As directed    Call MD for:  persistant dizziness or light-headedness    Complete by:  As directed    Call MD for:  persistant nausea and vomiting    Complete by:  As directed    Call MD for:  redness, tenderness, or signs of infection (pain, swelling, redness, odor or green/yellow discharge around incision site)    Complete by:  As directed    Call MD for:  severe uncontrolled pain    Complete by:  As directed    Call MD for:  temperature >100.4    Complete by:  As directed    Diet - low sodium heart healthy    Complete by:  As directed    Increase activity slowly    Complete by:  As directed      Allergies as of 03/19/2017   No Known Allergies     Medication List    TAKE these medications   atenolol 50 MG tablet Commonly known as:  TENORMIN Take 1 tablet (50 mg total) by mouth daily. What changed:  when to take this   benzonatate 200 MG capsule Commonly known as:  TESSALON Take 1 capsule (200 mg total) by mouth 2 (two) times daily as needed for cough.   buPROPion 150 MG 12 hr tablet Commonly known as:  WELLBUTRIN SR Take 1 tablet (150 mg total) by mouth 2 (two) times daily.   cloNIDine 0.2 MG tablet Commonly known as:  CATAPRES Take 1 tablet (0.2 mg total) by mouth 2 (two) times daily.   gabapentin 300 MG capsule Commonly known as:  NEURONTIN Take 3 capsules (900 mg total) by mouth 3 (three) times daily.   naproxen 500 MG tablet Commonly known as:  NAPROSYN Take 1 tablet (500 mg total) by mouth 2 (two) times daily as needed for mild  pain.   potassium chloride 10 MEQ tablet Commonly known as:  K-DUR Take 1 tablet (10 mEq total) by mouth 2 (two) times daily.       No Known Allergies  Consultations:  None   Procedures/Studies: Dg Chest 2 View  Result Date: 03/18/2017 CLINICAL DATA:  Middle chest pain EXAM: CHEST  2 VIEW COMPARISON:  11/12/2016 FINDINGS: The heart size and mediastinal contours are within normal limits. Both lungs are clear. The visualized skeletal structures are unremarkable. IMPRESSION: No active cardiopulmonary disease. Electronically Signed   By: Jasmine Pang M.D.   On: 03/18/2017 00:00   Ct Angio Chest Pe W Or Wo Contrast  Result Date: 03/18/2017 CLINICAL DATA:  Acute onset of upper mid chest pain, shortness of breath and left upper quadrant abdominal pain. Elevated D-dimer. Initial encounter. EXAM: CT  ANGIOGRAPHY CHEST WITH CONTRAST TECHNIQUE: Multidetector CT imaging of the chest was performed using the standard protocol during bolus administration of intravenous contrast. Multiplanar CT image reconstructions and MIPs were obtained to evaluate the vascular anatomy. CONTRAST:  75 mL of Isovue 370 IV contrast COMPARISON:  Chest radiograph performed 03/17/2017 FINDINGS: Cardiovascular:  There is no evidence of pulmonary embolus. The heart is normal in size. The thoracic aorta is grossly unremarkable. The great vessels are within normal limits. No calcific atherosclerotic disease is seen. Mediastinum/Nodes: The mediastinum is unremarkable in appearance. No mediastinal lymphadenopathy is seen. No pericardial effusion is identified. The thyroid gland is unremarkable. No axillary lymphadenopathy is seen. Lungs/Pleura: The lungs are essentially clear bilaterally. No focal consolidation, pleural effusion or pneumothorax is seen. No masses are identified. Upper Abdomen: The visualized portions of the liver and spleen are grossly unremarkable. The visualized portions of the gallbladder, adrenal glands and left  kidney are within normal limits. Musculoskeletal: No acute osseous abnormalities are identified. The visualized musculature is unremarkable in appearance. Review of the MIP images confirms the above findings. IMPRESSION: 1. No evidence of pulmonary embolus. 2. Lungs clear bilaterally. Electronically Signed   By: Roanna Raider M.D.   On: 03/18/2017 01:20   Dg Chest Port 1 View  Result Date: 03/18/2017 CLINICAL DATA:  Middle CP today EXAM: PORTABLE CHEST 1 VIEW COMPARISON:  Chest CT 03/18/2017, chest x-ray 03/17/2017 FINDINGS: The heart size and mediastinal contours are within normal limits. Both lungs are clear. The visualized skeletal structures are unremarkable. IMPRESSION: No active disease. Electronically Signed   By: Norva Pavlov M.D.   On: 03/18/2017 10:41    Discharge Exam: Vitals:   03/19/17 0459 03/19/17 1241  BP: (!) 152/97 (!) 146/100  Pulse: 87 79  Resp: 18 18  Temp: 97.5 F (36.4 C) 98 F (36.7 C)   Vitals:   03/18/17 1943 03/18/17 2055 03/19/17 0459 03/19/17 1241  BP: (!) 142/90 (!) 147/91 (!) 152/97 (!) 146/100  Pulse: 78 85 87 79  Resp:  18 18 18   Temp:  98.6 F (37 C) 97.5 F (36.4 C) 98 F (36.7 C)  TempSrc:  Oral Oral Oral  SpO2:  100% 99% 100%  Weight:      Height:        General: Pt is alert, awake, not in acute distress. HEET: No neck stiffness, OP clear  Cardiovascular: RRR, S1/S2 +,No murmurs, rubs, or gallops Respiratory: CTA bilaterally, no wheezing, no rhonchi Abdominal: Soft, NT, ND, bowel sounds + Extremities: no LE edema, no cyanosis   The results of significant diagnostics from this hospitalization (including imaging, microbiology, ancillary and laboratory) are listed below for reference.     Microbiology: Recent Results (from the past 240 hour(s))  Blood culture (routine x 2)     Status: None (Preliminary result)   Collection Time: 03/18/17  2:10 AM  Result Value Ref Range Status   Specimen Description BLOOD RIGHT ARM  Final    Special Requests   Final    BOTTLES DRAWN AEROBIC AND ANAEROBIC Blood Culture adequate volume   Culture   Final    NO GROWTH < 24 HOURS Performed at [facility], [address] [place], [place] [address]    Report Status PENDING  Incomplete  Blood culture (routine x 2)     Status: None (Preliminary result)   Collection Time: 03/18/17  2:20 AM  Result Value Ref Range Status   Specimen Description BLOOD LEFT ARM  Final  Special Requests   Final    BOTTLES DRAWN AEROBIC AND ANAEROBIC Blood Culture adequate volume   Culture   Final    NO GROWTH < 24 HOURS Performed at [facility], [address] [place], [place] [address]    Report Status PENDING  Incomplete  MRSA PCR Screening     Status: None   Collection Time: 03/18/17  5:45 AM  Result Value Ref Range Status   MRSA by PCR NEGATIVE NEGATIVE Final    Comment:        The GeneXpert MRSA Assay (FDA approved for NASAL specimens only), is one component of a comprehensive MRSA colonization surveillance program. It is not intended to diagnose MRSA infection nor to guide or monitor treatment for MRSA infections.   Culture, Urine     Status: None   Collection Time: 03/18/17  9:35 AM  Result Value Ref Range Status   Specimen Description URINE, RANDOM  Final   Special Requests NONE  Final   Culture   Final    NO GROWTH Performed at [facility], [address] [place], [place] [address]    Report Status 03/19/2017 FINAL  Final     Labs: BNP (last 3 results) No results for input(s): BNP in the last 8760 hours. Basic Metabolic Panel:  Recent Labs Lab 03/18/17 0002 03/18/17 1201 03/19/17 0541  NA 135 141 140  K 3.1* 3.5 3.8  CL 102 107 107  CO2 19* 26 25  GLUCOSE 180* 105* 96  BUN 22* 11 11  CREATININE 1.61* 1.20* 1.19*  CALCIUM 9.4 8.9 8.7*   Liver Function Tests: No results for input(s): AST, ALT, ALKPHOS, BILITOT, PROT, ALBUMIN in the last 168 hours. No results for input(s): LIPASE, AMYLASE  in the last 168 hours. No results for input(s): AMMONIA in the last 168 hours. CBC:  Recent Labs Lab 03/18/17 0002 03/18/17 1201 03/18/17 2037 03/19/17 0541  WBC 19.1* 19.7* 13.9* 12.0*  NEUTROABS 14.1*  --  9.5* 7.0  HGB 15.1* 13.8 13.4 13.7  HCT 44.5 42.2 40.8 41.8  MCV 59.9* 63.6* 63.3* 63.6*  PLT 397 325 271 237   Cardiac Enzymes:  Recent Labs Lab 03/18/17 0002  TROPONINI <0.03   BNP: Invalid input(s): POCBNP CBG: No results for input(s): GLUCAP in the last 168 hours. D-Dimer  Recent Labs  03/18/17 0002  DDIMER 1.85*   Hgb A1c No results for input(s): HGBA1C in the last 72 hours. Lipid Profile No results for input(s): CHOL, HDL, LDLCALC, TRIG, CHOLHDL, LDLDIRECT in the last 72 hours. Thyroid function studies No results for input(s): TSH, T4TOTAL, T3FREE, THYROIDAB in the last 72 hours.  Invalid input(s): FREET3 Anemia work up No results for input(s): VITAMINB12, FOLATE, FERRITIN, TIBC, IRON, RETICCTPCT in the last 72 hours. Urinalysis    Component Value Date/Time   COLORURINE YELLOW 03/18/2017 0206   APPEARANCEUR CLEAR 03/18/2017 0206   LABSPEC >1.046 (H) 03/18/2017 0206   PHURINE 6.0 03/18/2017 0206   GLUCOSEU NEGATIVE 03/18/2017 0206   HGBUR NEGATIVE 03/18/2017 0206   BILIRUBINUR NEGATIVE 03/18/2017 0206   KETONESUR NEGATIVE 03/18/2017 0206   PROTEINUR NEGATIVE 03/18/2017 0206   NITRITE NEGATIVE 03/18/2017 0206   LEUKOCYTESUR SMALL (A) 03/18/2017 0206   Sepsis Labs Invalid input(s): PROCALCITONIN,  WBC,  LACTICIDVEN Microbiology Recent Results (from the past 240 hour(s))  Blood culture (routine x 2)     Status: None (Preliminary result)   Collection Time: 03/18/17  2:10 AM  Result Value Ref Range Status  Specimen Description BLOOD RIGHT ARM  Final   Special Requests   Final    BOTTLES DRAWN AEROBIC AND ANAEROBIC Blood Culture adequate volume   Culture   Final    NO GROWTH < 24 HOURS Performed at [facility], [address]  [place], [place] [address]    Report Status PENDING  Incomplete  Blood culture (routine x 2)     Status: None (Preliminary result)   Collection Time: 03/18/17  2:20 AM  Result Value Ref Range Status   Specimen Description BLOOD LEFT ARM  Final   Special Requests   Final    BOTTLES DRAWN AEROBIC AND ANAEROBIC Blood Culture adequate volume   Culture   Final    NO GROWTH < 24 HOURS Performed at [facility], [address] [place], [place] [address]    Report Status PENDING  Incomplete  MRSA PCR Screening     Status: None   Collection Time: 03/18/17  5:45 AM  Result Value Ref Range Status   MRSA by PCR NEGATIVE NEGATIVE Final    Comment:        The GeneXpert MRSA Assay (FDA approved for NASAL specimens only), is one component of a comprehensive MRSA colonization surveillance program. It is not intended to diagnose MRSA infection nor to guide or monitor treatment for MRSA infections.   Culture, Urine     Status: None   Collection Time: 03/18/17  9:35 AM  Result Value Ref Range Status   Specimen Description URINE, RANDOM  Final   Special Requests NONE  Final   Culture   Final    NO GROWTH Performed at [facility], [address], [place], [place] [address]    Report Status 03/19/2017 FINAL  Final    Time coordinating discharge: 35 minutes  SIGNED:  Latrelle Dodrill, MD  [facility] 03/19/2017, 2:43 PM  Pager please text page via  [email or web address] Password [email or web address]

## 2017-03-23 LAB — CULTURE, BLOOD (ROUTINE X 2)
CULTURE: NO GROWTH
Culture: NO GROWTH
SPECIAL REQUESTS: ADEQUATE
Special Requests: ADEQUATE

## 2017-03-28 MED FILL — ?BUPROPION HCL SR 150 MG TA: 150 | 30 days supply | Qty: 60 | Fill #1

## 2017-03-28 MED FILL — cloNIDine HCL 0.2 MG TABS: 0.2 | 30 days supply | Qty: 60 | Fill #1

## 2017-03-28 MED FILL — GABAPENTIN 300 MG CAPSULE: 300 | 30 days supply | Qty: 270 | Fill #0

## 2017-04-25 MED FILL — BUPROPION SR 150 MG TABLET: 150 | 30 days supply | Qty: 60 | Fill #2

## 2017-04-25 MED FILL — cloNIDine HCL 0.2 MG TABS: 0.2 | 30 days supply | Qty: 60 | Fill #2

## 2017-04-25 MED FILL — GABAPENTIN 300 MG CAPSULE: 300 | 30 days supply | Qty: 270 | Fill #1 | Status: TO

## 2018-11-10 IMAGING — CT CT ANGIO CHEST
2 of 8 series · 19 of 36 positions shown · IV contrast (isovue)
Comparison: Chest radiograph performed 03/17/2017

CLINICAL DATA: Acute onset of upper mid chest pain, shortness of
breath and left upper quadrant abdominal pain. Elevated D-dimer.
Initial encounter.

EXAM:
CT ANGIOGRAPHY CHEST WITH CONTRAST
TECHNIQUE: Multidetector CT imaging of the chest was performed using the
standard protocol during bolus administration of intravenous
contrast. Multiplanar CT image reconstructions and MIPs were
obtained to evaluate the vascular anatomy.
CONTRAST:  75 mL of Isovue 370 IV contrast

[Series 6: pe coronal mpr · coronal · 0.54mm/px · 1 of 129 slices shown]
[im 65/129  mediastinal]
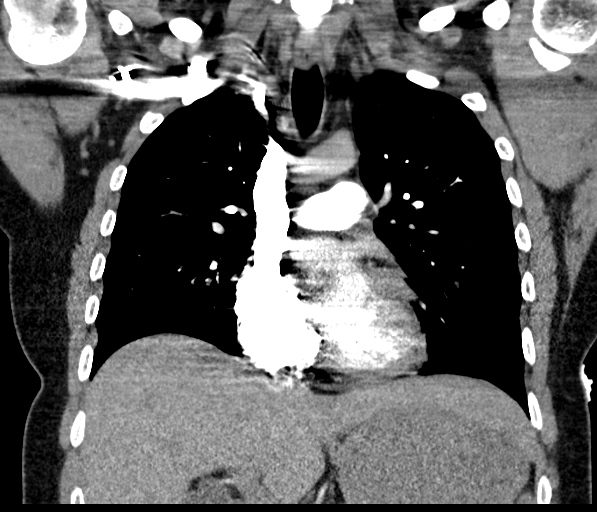

[Series 10: pe thins · axial · 0.72mm/px · z∈[-290,-35]mm · 18 of 286 slices shown]
[im 16/286  lung]
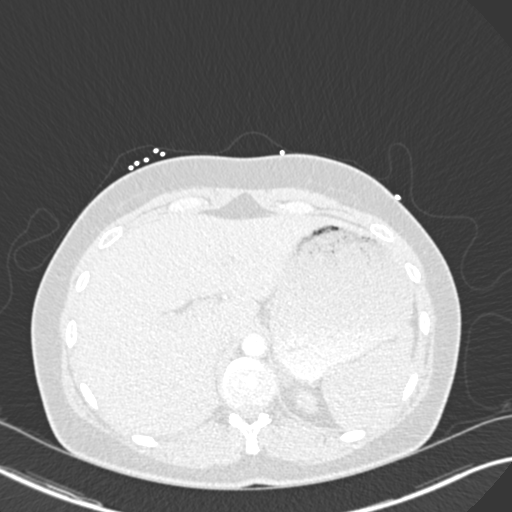
[im 31/286  mediastinal]
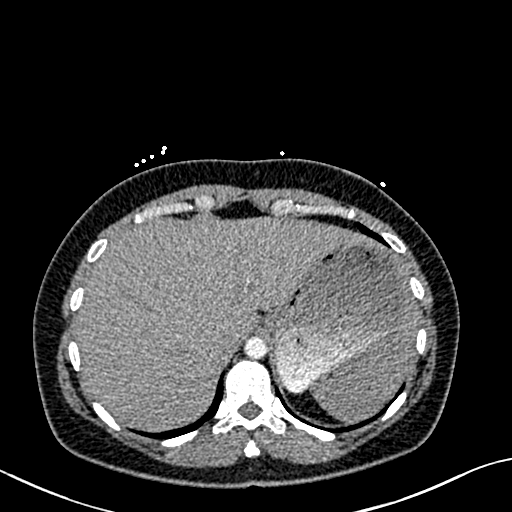
[im 46/286  lung]
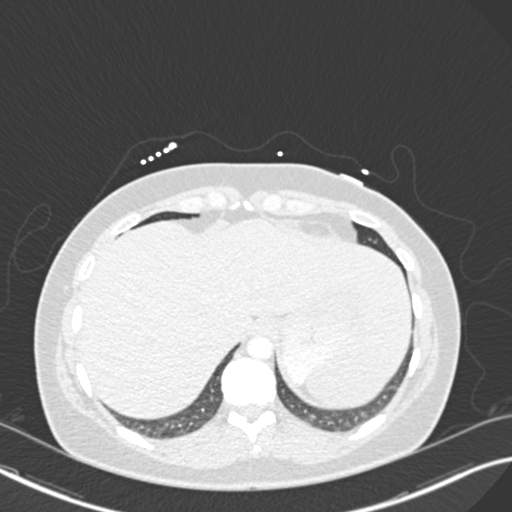
[im 61/286  mediastinal]
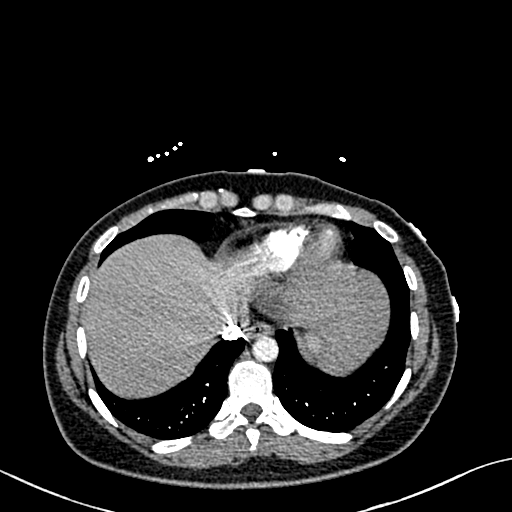
[im 76/286  lung]
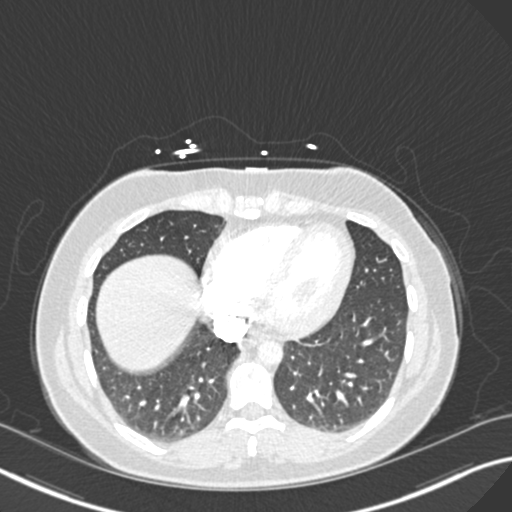
[im 91/286  mediastinal]
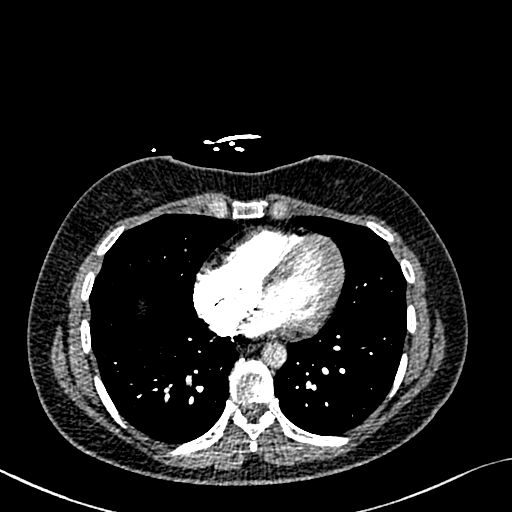
[im 106/286  lung]
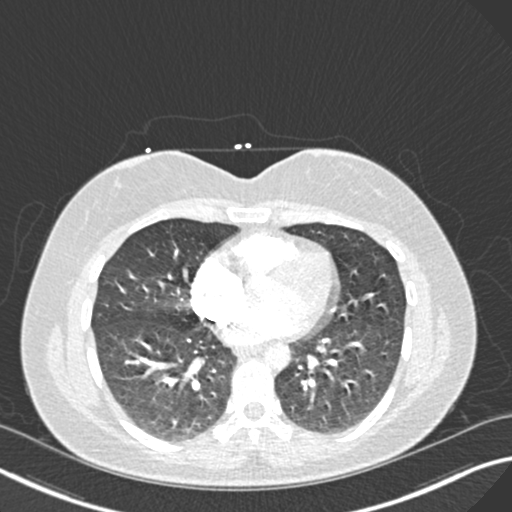
[im 121/286  mediastinal]
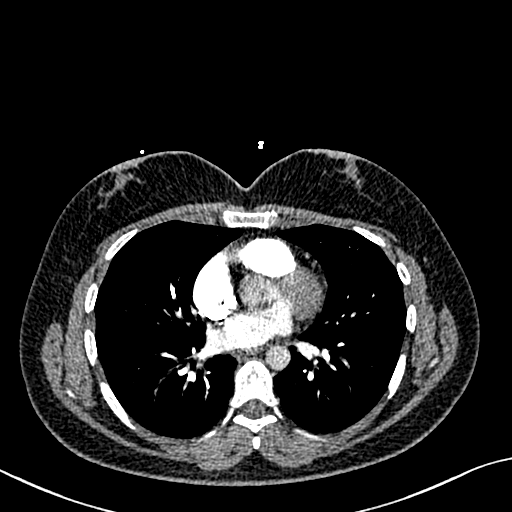
[im 136/286  lung]
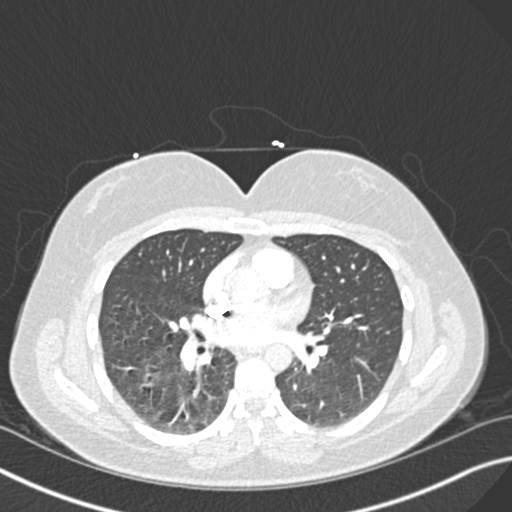
[im 151/286  mediastinal]
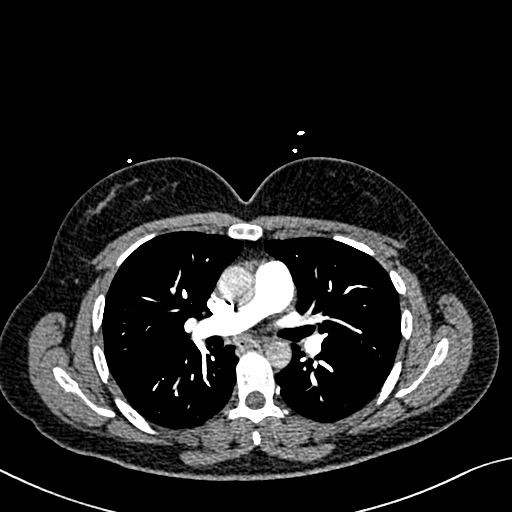
[im 166/286  lung]
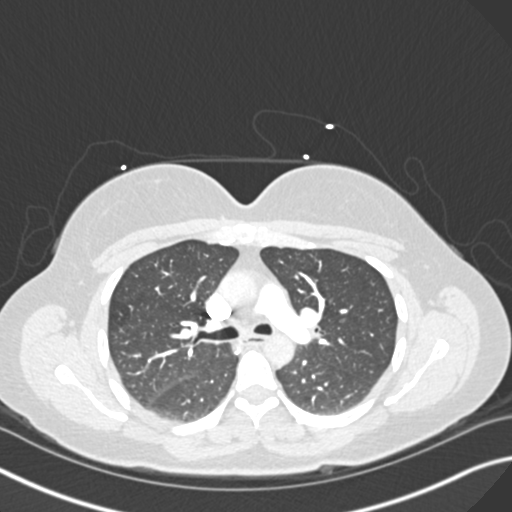
[im 181/286  mediastinal]
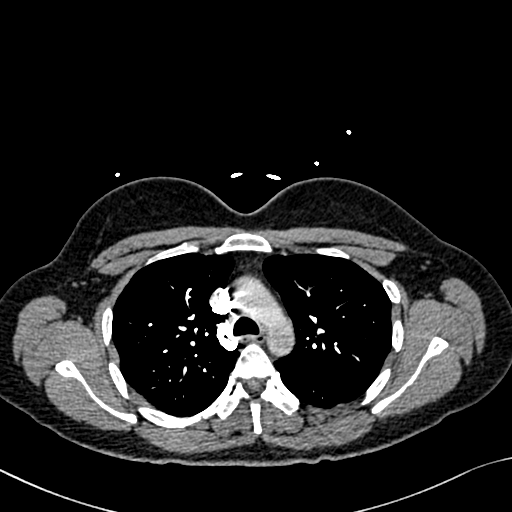
[im 196/286  lung]
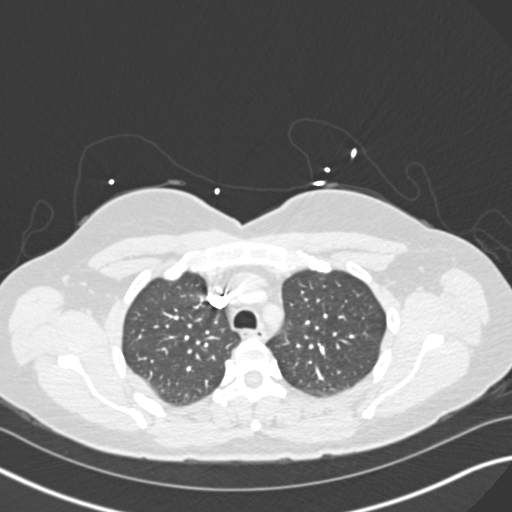
[im 211/286  mediastinal]
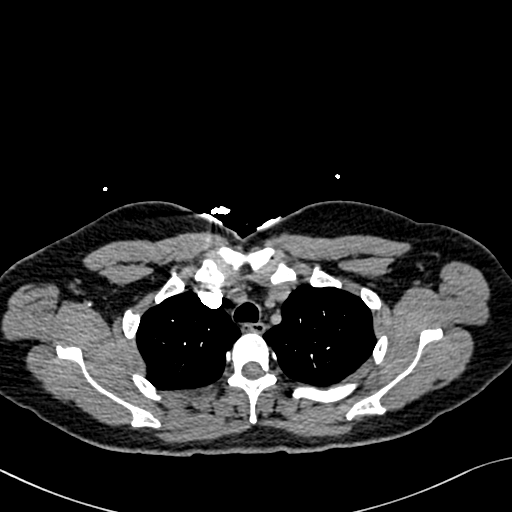
[im 226/286  lung]
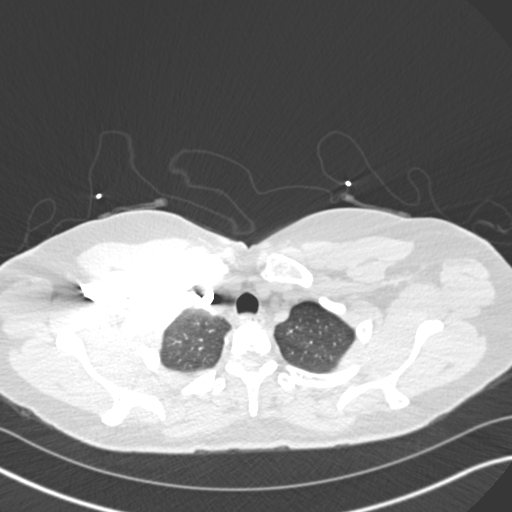
[im 241/286  mediastinal]
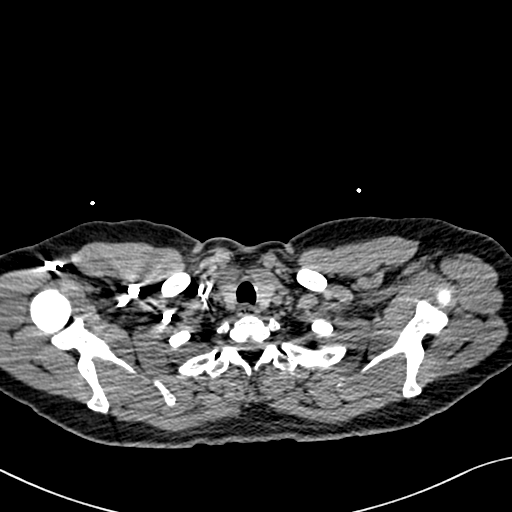
[im 256/286  lung]
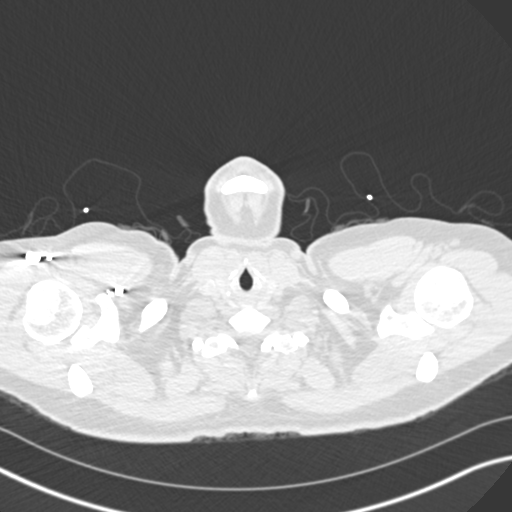
[im 271/286  mediastinal]
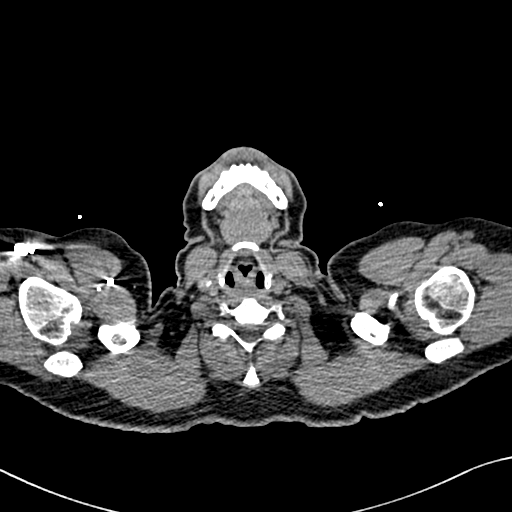

[19 of 36 positions shown; findings below may reference images not displayed]

FINDINGS: Cardiovascular:  There is no evidence of pulmonary embolus.

The heart is normal in size. The thoracic aorta is grossly
unremarkable. The great vessels are within normal limits. No
calcific atherosclerotic disease is seen.

Mediastinum/Nodes: The mediastinum is unremarkable in appearance. No
mediastinal lymphadenopathy is seen. No pericardial effusion is
identified. The thyroid gland is unremarkable. No axillary
lymphadenopathy is seen.

Lungs/Pleura: The lungs are essentially clear bilaterally. No focal
consolidation, pleural effusion or pneumothorax is seen. No masses
are identified.

Upper Abdomen: The visualized portions of the liver and spleen are
grossly unremarkable. The visualized portions of the gallbladder,
adrenal glands and left kidney are within normal limits.

Musculoskeletal: No acute osseous abnormalities are identified. The
visualized musculature is unremarkable in appearance.

Review of the MIP images confirms the above findings.
IMPRESSION: 1. No evidence of pulmonary embolus.
2. Lungs clear bilaterally.
